# Patient Record
Sex: Female | Born: 1970 | Race: Black or African American | Hispanic: No | Marital: Married | Smoking: Never smoker
Health system: Southern US, Community
[De-identification: ages and names within clinical notes are randomized; demographics above are authoritative.]

## PROBLEM LIST (undated history)

## (undated) DIAGNOSIS — J45909 Unspecified asthma, uncomplicated: Secondary | ICD-10-CM

## (undated) DIAGNOSIS — I10 Essential (primary) hypertension: Secondary | ICD-10-CM

## (undated) DIAGNOSIS — R7303 Prediabetes: Secondary | ICD-10-CM

## (undated) DIAGNOSIS — C73 Malignant neoplasm of thyroid gland: Secondary | ICD-10-CM

## (undated) DIAGNOSIS — E079 Disorder of thyroid, unspecified: Secondary | ICD-10-CM

## (undated) HISTORY — DX: Malignant neoplasm of thyroid gland: C73

## (undated) HISTORY — DX: Prediabetes: R73.03

## (undated) HISTORY — DX: Unspecified asthma, uncomplicated: J45.909

## (undated) HISTORY — DX: Disorder of thyroid, unspecified: E07.9

## (undated) HISTORY — DX: Essential (primary) hypertension: I10

---

## 1997-08-01 ENCOUNTER — Other Ambulatory Visit: Admission: RE | Admit: 1997-08-01 | Discharge: 1997-08-01 | Payer: Self-pay | Admitting: Obstetrics and Gynecology

## 1998-01-06 ENCOUNTER — Other Ambulatory Visit: Admission: RE | Admit: 1998-01-06 | Discharge: 1998-01-06 | Payer: Self-pay | Admitting: Obstetrics and Gynecology

## 1998-12-31 ENCOUNTER — Other Ambulatory Visit: Admission: RE | Admit: 1998-12-31 | Discharge: 1998-12-31 | Payer: Self-pay | Admitting: Obstetrics and Gynecology

## 2000-01-06 ENCOUNTER — Other Ambulatory Visit: Admission: RE | Admit: 2000-01-06 | Discharge: 2000-01-06 | Payer: Self-pay | Admitting: Obstetrics and Gynecology

## 2003-03-22 ENCOUNTER — Other Ambulatory Visit: Admission: RE | Admit: 2003-03-22 | Discharge: 2003-03-22 | Payer: Self-pay | Admitting: Obstetrics and Gynecology

## 2008-07-22 ENCOUNTER — Telehealth (INDEPENDENT_AMBULATORY_CARE_PROVIDER_SITE_OTHER): Payer: Self-pay | Admitting: *Deleted

## 2008-07-24 ENCOUNTER — Encounter: Payer: Self-pay | Admitting: Obstetrics and Gynecology

## 2008-07-24 ENCOUNTER — Ambulatory Visit: Payer: Self-pay | Admitting: Obstetrics and Gynecology

## 2008-07-24 ENCOUNTER — Other Ambulatory Visit: Admission: RE | Admit: 2008-07-24 | Discharge: 2008-07-24 | Payer: Self-pay | Admitting: Obstetrics and Gynecology

## 2008-07-25 ENCOUNTER — Ambulatory Visit: Payer: Self-pay | Admitting: Obstetrics and Gynecology

## 2008-07-29 ENCOUNTER — Ambulatory Visit: Payer: Self-pay | Admitting: Internal Medicine

## 2008-07-29 DIAGNOSIS — J45909 Unspecified asthma, uncomplicated: Secondary | ICD-10-CM | POA: Insufficient documentation

## 2008-07-29 DIAGNOSIS — I10 Essential (primary) hypertension: Secondary | ICD-10-CM

## 2008-07-29 DIAGNOSIS — E559 Vitamin D deficiency, unspecified: Secondary | ICD-10-CM | POA: Insufficient documentation

## 2008-07-29 DIAGNOSIS — M545 Low back pain, unspecified: Secondary | ICD-10-CM | POA: Insufficient documentation

## 2008-07-29 DIAGNOSIS — M25559 Pain in unspecified hip: Secondary | ICD-10-CM

## 2008-07-29 DIAGNOSIS — L259 Unspecified contact dermatitis, unspecified cause: Secondary | ICD-10-CM

## 2008-07-29 LAB — CONVERTED CEMR LAB
ALT: 22 units/L (ref 0–35)
Albumin: 4 g/dL (ref 3.5–5.2)
Basophils Absolute: 0.1 10*3/uL (ref 0.0–0.1)
Bilirubin Urine: NEGATIVE
Bilirubin, Direct: 0.1 mg/dL (ref 0.0–0.3)
CO2: 29 meq/L (ref 19–32)
Calcium: 8.8 mg/dL (ref 8.4–10.5)
Chloride: 106 meq/L (ref 96–112)
Eosinophils Absolute: 0.2 10*3/uL (ref 0.0–0.7)
Glucose, Bld: 86 mg/dL (ref 70–99)
Hemoglobin, Urine: NEGATIVE
Hemoglobin: 14 g/dL (ref 12.0–15.0)
Ketones, ur: NEGATIVE mg/dL
Leukocytes, UA: NEGATIVE
Lymphocytes Relative: 29.2 % (ref 12.0–46.0)
Lymphs Abs: 2.2 10*3/uL (ref 0.7–4.0)
MCHC: 34.2 g/dL (ref 30.0–36.0)
MCV: 88.3 fL (ref 78.0–100.0)
Monocytes Absolute: 0.3 10*3/uL (ref 0.1–1.0)
Neutro Abs: 4.6 10*3/uL (ref 1.4–7.7)
Nitrite: NEGATIVE
RDW: 13 % (ref 11.5–14.6)
Sed Rate: 13 mm/hr (ref 0–22)
Sodium: 141 meq/L (ref 135–145)
TSH: 2.11 microintl units/mL (ref 0.35–5.50)
Total Protein: 7.4 g/dL (ref 6.0–8.3)
Urobilinogen, UA: 0.2 (ref 0.0–1.0)

## 2008-08-02 ENCOUNTER — Telehealth: Payer: Self-pay | Admitting: Internal Medicine

## 2009-01-11 HISTORY — PX: THYROIDECTOMY: SHX17

## 2010-03-12 ENCOUNTER — Encounter (INDEPENDENT_AMBULATORY_CARE_PROVIDER_SITE_OTHER): Payer: Federal, State, Local not specified - PPO | Admitting: Obstetrics and Gynecology

## 2010-03-12 DIAGNOSIS — Z01419 Encounter for gynecological examination (general) (routine) without abnormal findings: Secondary | ICD-10-CM

## 2013-06-21 ENCOUNTER — Telehealth: Payer: Self-pay | Admitting: Internal Medicine

## 2013-06-21 NOTE — Telephone Encounter (Signed)
appt schedule on 07/25/13 with Plot.

## 2013-06-21 NOTE — Telephone Encounter (Signed)
Ok to sch an OV to re-est. It is a very narrow window! Thx

## 2013-06-21 NOTE — Telephone Encounter (Signed)
Pt's mother Keiasha Diep) call request for her daughter to reestablish with Dr. Alain Marion due to [facility] difficulty, want referral to neurology. Pt is living out of the country ([place]) right now and will be here ([place]) around 07/25/13-07/27/13. Please advise, last ov 07/2008 (bcbs is insurance). Please advise. Please call [phone]

## 2013-07-25 ENCOUNTER — Telehealth: Payer: Self-pay | Admitting: Internal Medicine

## 2013-07-25 ENCOUNTER — Encounter: Payer: Self-pay | Admitting: Internal Medicine

## 2013-07-25 ENCOUNTER — Ambulatory Visit (INDEPENDENT_AMBULATORY_CARE_PROVIDER_SITE_OTHER): Payer: Federal, State, Local not specified - PPO | Admitting: Internal Medicine

## 2013-07-25 VITALS — BP 110/80 | HR 76 | Temp 98.2°F | Resp 16 | Ht 69.0 in | Wt 228.0 lb

## 2013-07-25 DIAGNOSIS — R197 Diarrhea, unspecified: Secondary | ICD-10-CM | POA: Insufficient documentation

## 2013-07-25 DIAGNOSIS — E0789 Other specified disorders of thyroid: Secondary | ICD-10-CM

## 2013-07-25 DIAGNOSIS — M545 Low back pain, unspecified: Secondary | ICD-10-CM

## 2013-07-25 DIAGNOSIS — I10 Essential (primary) hypertension: Secondary | ICD-10-CM

## 2013-07-25 DIAGNOSIS — E034 Atrophy of thyroid (acquired): Secondary | ICD-10-CM

## 2013-07-25 DIAGNOSIS — E039 Hypothyroidism, unspecified: Secondary | ICD-10-CM | POA: Insufficient documentation

## 2013-07-25 DIAGNOSIS — R413 Other amnesia: Secondary | ICD-10-CM

## 2013-07-25 DIAGNOSIS — R1031 Right lower quadrant pain: Secondary | ICD-10-CM

## 2013-07-25 DIAGNOSIS — E038 Other specified hypothyroidism: Secondary | ICD-10-CM

## 2013-07-25 DIAGNOSIS — J45909 Unspecified asthma, uncomplicated: Secondary | ICD-10-CM

## 2013-07-25 NOTE — Patient Instructions (Signed)
Gluten free trial (no wheat products) for 4-6 weeks. OK to use gluten-free bread and gluten-free pasta.  Milk free trial (no milk, ice cream, cheese and yogurt) for 4-6 weeks. OK to use almond, coconut, rice or soy milk. "Almond breeze" brand tastes good.  

## 2013-07-25 NOTE — Assessment & Plan Note (Signed)
Labs

## 2013-07-25 NOTE — Assessment & Plan Note (Signed)
x3-4 mo ?etiol CT abd

## 2013-07-25 NOTE — Assessment & Plan Note (Signed)
Doing well 

## 2013-07-25 NOTE — Progress Notes (Signed)
Pre visit review using our clinic review tool, if applicable. No additional management support is needed unless otherwise documented below in the visit note. 

## 2013-07-25 NOTE — Progress Notes (Signed)
   Subjective:    HPI  New pt - not seen in>3years  Has a 43 yo dtr  C/o R side pain back and to the front) off and on - she had an [place] in [place] which a ok  C/o an episode of diarrhea in Feb 2015 x2d and in May 2015 x 3 d. C/o loose stools, bloating and taking Imodium a lot. She will see Dr Michail Sermon (GI)  C/o memory issues, forgetful, lack of focus x months  C/o neck, back pain  Doesn't sleep well    Review of Systems  Constitutional: Negative for chills, activity change, appetite change, fatigue and unexpected weight change.  HENT: Negative for congestion, mouth sores and sinus pressure.   Eyes: Negative for visual disturbance.  Respiratory: Negative for cough and chest tightness.   Gastrointestinal: Negative for nausea and abdominal pain.  Genitourinary: Negative for frequency, difficulty urinating and vaginal pain.  Musculoskeletal: Negative for arthralgias, back pain and gait problem.  Skin: Negative for pallor and rash.  Neurological: Negative for dizziness, tremors, weakness, numbness and headaches.  Psychiatric/Behavioral: Positive for sleep disturbance. Negative for confusion. The patient is nervous/anxious.        Objective:   Physical Exam  Constitutional: She appears well-developed. No distress.  HENT:  Head: Normocephalic.  Right Ear: External ear normal.  Left Ear: External ear normal.  Nose: Nose normal.  Mouth/Throat: Oropharynx is clear and moist.  Eyes: Conjunctivae are normal. Pupils are equal, round, and reactive to light. Right eye exhibits no discharge. Left eye exhibits no discharge.  Neck: Normal range of motion. Neck supple. No JVD present. No tracheal deviation present. No thyromegaly present.  Cardiovascular: Normal rate, regular rhythm and normal heart sounds.   Pulmonary/Chest: No stridor. No respiratory distress. She has no wheezes.  Abdominal: Soft. Bowel sounds are normal. She exhibits no distension and no mass. There is no tenderness.  There is no rebound and no guarding.  Musculoskeletal: She exhibits no edema and no tenderness.  Lymphadenopathy:    She has no cervical adenopathy.  Neurological: She displays normal reflexes. No cranial nerve deficit. She exhibits normal muscle tone. Coordination normal.  Skin: No rash noted. No erythema.  Psychiatric: Her behavior is normal. Judgment and thought content normal.    A complex case      Assessment & Plan:

## 2013-07-25 NOTE — Assessment & Plan Note (Signed)
Continue with current prescription therapy as reflected on the Med list.  

## 2013-07-25 NOTE — Assessment & Plan Note (Addendum)
7/15 mild Denies OSA ?stress related  Labs Neurol ref

## 2013-07-25 NOTE — Telephone Encounter (Signed)
Relevant patient education assigned to patient using Emmi. ° °

## 2013-07-25 NOTE — Assessment & Plan Note (Signed)
R sided x months

## 2013-07-25 NOTE — Assessment & Plan Note (Signed)
2015 ?etiology R/o IBD, food intolerances, etc GI cons on Fri Dr Jiles Prows Labs

## 2013-07-26 ENCOUNTER — Other Ambulatory Visit (INDEPENDENT_AMBULATORY_CARE_PROVIDER_SITE_OTHER): Payer: Federal, State, Local not specified - PPO

## 2013-07-26 DIAGNOSIS — E038 Other specified hypothyroidism: Secondary | ICD-10-CM

## 2013-07-26 DIAGNOSIS — R197 Diarrhea, unspecified: Secondary | ICD-10-CM

## 2013-07-26 DIAGNOSIS — E034 Atrophy of thyroid (acquired): Secondary | ICD-10-CM

## 2013-07-26 DIAGNOSIS — R413 Other amnesia: Secondary | ICD-10-CM

## 2013-07-26 DIAGNOSIS — M545 Low back pain, unspecified: Secondary | ICD-10-CM

## 2013-07-26 DIAGNOSIS — I10 Essential (primary) hypertension: Secondary | ICD-10-CM

## 2013-07-26 DIAGNOSIS — E0789 Other specified disorders of thyroid: Secondary | ICD-10-CM

## 2013-07-26 LAB — CBC WITH DIFFERENTIAL/PLATELET
BASOS PCT: 0.4 % (ref 0.0–3.0)
Basophils Absolute: 0 10*3/uL (ref 0.0–0.1)
EOS PCT: 4.6 % (ref 0.0–5.0)
Eosinophils Absolute: 0.3 10*3/uL (ref 0.0–0.7)
HEMATOCRIT: 38.3 % (ref 36.0–46.0)
Hemoglobin: 12.9 g/dL (ref 12.0–15.0)
LYMPHS ABS: 1.9 10*3/uL (ref 0.7–4.0)
Lymphocytes Relative: 32.1 % (ref 12.0–46.0)
MCHC: 33.6 g/dL (ref 30.0–36.0)
MCV: 88 fl (ref 78.0–100.0)
Monocytes Absolute: 0.4 10*3/uL (ref 0.1–1.0)
Monocytes Relative: 7.6 % (ref 3.0–12.0)
Neutro Abs: 3.2 10*3/uL (ref 1.4–7.7)
Neutrophils Relative %: 55.3 % (ref 43.0–77.0)
PLATELETS: 250 10*3/uL (ref 150.0–400.0)
RBC: 4.35 Mil/uL (ref 3.87–5.11)
RDW: 13.9 % (ref 11.5–15.5)
WBC: 5.9 10*3/uL (ref 4.0–10.5)

## 2013-07-26 LAB — H. PYLORI ANTIBODY, IGG: H Pylori IgG: NEGATIVE

## 2013-07-26 LAB — BASIC METABOLIC PANEL
BUN: 13 mg/dL (ref 6–23)
CHLORIDE: 106 meq/L (ref 96–112)
CO2: 30 meq/L (ref 19–32)
CREATININE: 1.2 mg/dL (ref 0.4–1.2)
Calcium: 9 mg/dL (ref 8.4–10.5)
GFR: 61.12 mL/min (ref >60.00)
Glucose, Bld: 89 mg/dL (ref 70–99)
Potassium: 3.6 mEq/L (ref 3.5–5.1)
Sodium: 139 mEq/L (ref 135–145)

## 2013-07-26 LAB — LIPID PANEL
CHOL/HDL RATIO: 3
Cholesterol: 164 mg/dL (ref 0–200)
HDL: 47.9 mg/dL (ref >39.00)
LDL Cholesterol: 104 mg/dL — ABNORMAL HIGH (ref 0–99)
NonHDL: 116.1
Triglycerides: 61 mg/dL (ref 0.0–149.0)
VLDL: 12.2 mg/dL (ref 0.0–40.0)

## 2013-07-26 LAB — URINALYSIS
BILIRUBIN URINE: NEGATIVE
Hgb urine dipstick: NEGATIVE
KETONES UR: NEGATIVE
Leukocytes, UA: NEGATIVE
Nitrite: NEGATIVE
PH: 6 (ref 5.0–8.0)
Specific Gravity, Urine: 1.025 (ref 1.000–1.030)
Total Protein, Urine: NEGATIVE
Urine Glucose: NEGATIVE
Urobilinogen, UA: 0.2 (ref 0.0–1.0)

## 2013-07-26 LAB — HEPATIC FUNCTION PANEL
ALT: 25 U/L (ref 0–35)
AST: 30 U/L (ref 0–37)
Albumin: 3.8 g/dL (ref 3.5–5.2)
Alkaline Phosphatase: 43 U/L (ref 39–117)
Bilirubin, Direct: 0.1 mg/dL (ref 0.0–0.3)
TOTAL PROTEIN: 6.9 g/dL (ref 6.0–8.3)
Total Bilirubin: 0.4 mg/dL (ref 0.2–1.2)

## 2013-07-26 LAB — VITAMIN D 25 HYDROXY (VIT D DEFICIENCY, FRACTURES): VITD: 31.5 ng/mL

## 2013-07-26 LAB — IBC PANEL
IRON: 48 ug/dL (ref 42–145)
Saturation Ratios: 13.1 % — ABNORMAL LOW (ref 20.0–50.0)
TRANSFERRIN: 262.3 mg/dL (ref 212.0–360.0)

## 2013-07-26 LAB — TSH: TSH: 1.78 u[IU]/mL (ref 0.35–4.50)

## 2013-07-26 LAB — T4, FREE: FREE T4: 0.93 ng/dL (ref 0.60–1.60)

## 2013-07-26 LAB — RHEUMATOID FACTOR

## 2013-07-26 LAB — SEDIMENTATION RATE: Sed Rate: 19 mm/hr (ref 0–22)

## 2013-07-26 LAB — VITAMIN B12: Vitamin B-12: 582 pg/mL (ref 211–911)

## 2013-07-27 LAB — GIARDIA/CRYPTOSPORIDIUM (EIA)
Cryptosporidium Screen (EIA): NEGATIVE
GIARDIA SCREEN (EIA): NEGATIVE

## 2013-07-27 LAB — ALLERGEN FOOD PROFILE SPECIFIC IGE
APPLE: 0.11 kU/L — AB
CORN: 0.21 kU/L — AB
Chicken IgE: <0.1 kU/L
Egg White IgE: <0.1 kU/L
Fish Cod: <0.1 kU/L
IgE (Immunoglobulin E), Serum: 41.7 IU/mL (ref 0.0–180.0)
Milk IgE: <0.1 kU/L
Shrimp IgE: <0.1 kU/L
Tuna IgE: <0.1 kU/L
Wheat IgE: <0.1 kU/L

## 2013-08-01 LAB — IGG FOOD PANEL
ALLERGEN BEEF IGG: 6 ug/mL — AB (ref <2.0)
ALLERGEN EGG YOLK IGG: 7.5 ug/mL — AB (ref <2.0)
Allergen, Milk, IgG: 14.1 ug/mL — ABNORMAL HIGH (ref <0.15)
Corn, IgG: <0.15 ug/mL (ref <0.15)
Egg white, IgG: 7 ug/mL — ABNORMAL HIGH (ref <2.0)
Peanut, IgG: <0.15 ug/mL (ref <0.15)
Wheat, IgG: 1.25 ug/mL — ABNORMAL HIGH (ref <0.15)

## 2013-08-02 ENCOUNTER — Telehealth: Payer: Self-pay | Admitting: *Deleted

## 2013-08-02 NOTE — Telephone Encounter (Signed)
Left msg on triage stating her daughter received a msg on mychart stating the CT scan appt was made for tomorrow. Daughter is out of the country she is in [place] want return until Sunday. Wanting to reschedule CT. Called pt om back gave her # to CT dept @ cardiology...Johny Chess

## 2013-08-03 ENCOUNTER — Other Ambulatory Visit: Payer: Federal, State, Local not specified - PPO

## 2013-08-06 ENCOUNTER — Other Ambulatory Visit: Payer: Federal, State, Local not specified - PPO

## 2013-08-07 ENCOUNTER — Encounter: Payer: Self-pay | Admitting: Neurology

## 2013-08-07 ENCOUNTER — Ambulatory Visit (INDEPENDENT_AMBULATORY_CARE_PROVIDER_SITE_OTHER): Payer: Federal, State, Local not specified - PPO | Admitting: Neurology

## 2013-08-07 ENCOUNTER — Ambulatory Visit (INDEPENDENT_AMBULATORY_CARE_PROVIDER_SITE_OTHER)
Admission: RE | Admit: 2013-08-07 | Discharge: 2013-08-07 | Disposition: A | Discharge status: Home / Self Care | Payer: Federal, State, Local not specified - PPO | Source: Ambulatory Visit | Attending: Internal Medicine | Admitting: Internal Medicine

## 2013-08-07 VITALS — BP 140/84 | HR 71 | Ht 69.0 in | Wt 235.9 lb

## 2013-08-07 DIAGNOSIS — M545 Low back pain, unspecified: Secondary | ICD-10-CM

## 2013-08-07 DIAGNOSIS — R413 Other amnesia: Secondary | ICD-10-CM

## 2013-08-07 DIAGNOSIS — R209 Unspecified disturbances of skin sensation: Secondary | ICD-10-CM

## 2013-08-07 DIAGNOSIS — R1031 Right lower quadrant pain: Secondary | ICD-10-CM

## 2013-08-07 DIAGNOSIS — R200 Anesthesia of skin: Secondary | ICD-10-CM

## 2013-08-07 MED ORDER — IOHEXOL 300 MG/ML  SOLN
100.0000 mL | Freq: Once | INTRAMUSCULAR | Status: AC | PRN
  Administered 2013-08-07: 100 mL via INTRAVENOUS

## 2013-08-07 NOTE — Patient Instructions (Addendum)
1. MRI brain with and without contrast 2. Follow-up in 3 months

## 2013-08-07 NOTE — Progress Notes (Signed)
NEUROLOGY CONSULTATION NOTE  JAKYA DOVIDIO MRN: [id number] DOB: 1970/09/01  Referring provider: Dr. Lew Dawes Primary care provider: Dr. Lew Dawes  Reason for consult:  Memory loss, numbness, falls  Dear Dr Alain Marion:  Thank you for your kind referral of Laurie Hawkins for consultation of the above symptoms. Although her history is well known to you, please allow me to reiterate it for the purpose of our medical record. Records and images were personally reviewed where available.  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 She has been living and working in [place] for the past 8 years, her family lives in [place].  She has noticed that over the past 1-2 years, she started noticing that she would be mid-thought then lose a word, or interject at inappropriate times because she is afraid she would lose her thought.  Her colleagues had been amazed with her recall of cases, however recently she has to revisit facts and has noticed that she is not absorbing information the way she should be.  She has difficulties concentrating, needing to start over after reading a page.  She now needs constant reminders on her phone. Her husband and daughter have reminded her that she was already told that information previously but she does not recall it.  She denies getting lost driving or missing bill payments or medication.  She does note that work is intense and has changed at the end of 2014, she is now doing different cases, which is "not my cup of tea."  She is often tired, usually sleeping 3-4 hours at night, and catching a nap on the train ride to work.  Over the past 2 years, she has had increased frequency of falls, quickly losing her balance or feeling dizzy, no loss of consciousness. Last fall was 2 months ago.  She has had  occasional headaches since her 30s, mostly around the frontal and orbital regions, occurring twice a week or so, lasting up to half a day, with no associated nausea/vomiting/photo/phonophobia. She has attributed this to hypertension and takes an unrecalled medication prescribed in [place]. She has occasional blurred vision upon awakening and has noticed that she sometimes sees something out of the corner of her eye but nothing is there.  She has noticed times where she feels her muscles are "jumping," she looks down and sees small twitching of the muscles in her arms, lasting up to an hour, no pain.  She has occasional leg cramps and pain in the soles of both feet.  She has chronic back pain and sees an Ortho specialist in [place], however she has noticed that pain is more intense the past few months, localized to the mid-lower back. She has occasional tingling pain in her right flank radiating down to the right groin. She has occasional neck pain.  She is scheduled for a CT pelvis today due to episodes of explosive diarrhea in Feb and May 2015 and will be seeing GI.  She denies any urinary incontinence or perineal numbness. No family history of similar symptoms, his father had memory problems from mini-strokes.  Laboratory Data: Lab Results  Component Value Date   WBC 5.9 07/26/2013   HGB 12.9 07/26/2013   HCT 38.3 07/26/2013   MCV 88.0 07/26/2013   PLT 250.0 07/26/2013     Chemistry      Component Value  Date/Time   NA 139 07/26/2013 0926   K 3.6 07/26/2013 0926   CL 106 07/26/2013 0926   CO2 30 07/26/2013 0926   BUN 13 07/26/2013 0926   CREATININE 1.2 07/26/2013 0926      Component Value Date/Time   CALCIUM 9.0 07/26/2013 0926   ALKPHOS 43 07/26/2013 0926   AST 30 07/26/2013 0926   ALT 25 07/26/2013 0926   BILITOT 0.4 07/26/2013 0926     Lab Results  Component Value Date   TSH 1.78 07/26/2013   Lab Results  Component Value Date   VITAMINB12 582 07/26/2013     PAST MEDICAL HISTORY: Past Medical  History  Diagnosis Date  . Hypertension     PAST SURGICAL HISTORY: Past Surgical History  Procedure Laterality Date  . Cesarean section  2009  . Thyroidectomy  2011    MEDICATIONS: Current Outpatient Prescriptions on File Prior to Visit  Medication Sig Dispense Refill  . albuterol (PROVENTIL HFA;VENTOLIN HFA) 108 (90 BASE) MCG/ACT inhaler Inhale into the lungs every 6 (six) hours as needed for wheezing or shortness of breath.      . cetirizine (ZYRTEC) 10 MG tablet Take 10 mg by mouth daily.      . Cholecalciferol LIQD by Does not apply route every 21 ( twenty-one) days.      Marland Kitchen EPINEPHrine (EPIPEN) 0.3 mg/0.3 mL IJ SOAJ injection Inject into the muscle once.      Marland Kitchen levothyroxine (SYNTHROID, LEVOTHROID) 100 MCG tablet Take 100 mcg by mouth as directed.      Marland Kitchen levothyroxine (SYNTHROID, LEVOTHROID) 125 MCG tablet Take 125 mcg by mouth as directed.      . Multiple Vitamin (MULTIVITAMIN) tablet Take 1 tablet by mouth daily.      . Olopatadine HCl (PAZEO) 0.7 % SOLN Apply to eye once.      Marland Kitchen Propylene Glycol (SYSTANE BALANCE OP) Apply 1 drop to eye 3 (three) times daily.       No current facility-administered medications on file prior to visit.    ALLERGIES: No Known Allergies  FAMILY HISTORY: Family History  Problem Relation Age of Onset  . Arthritis Other   . Alcohol abuse Other   . Cancer Other     Breast and Prostate  . Kidney disease Other   . Mental illness Other   . Diabetes Other   . Sarcoidosis Other   . Rheum arthritis Other     SOCIAL HISTORY: History   Social History  . Marital Status: Married    Spouse Name: N/A    Number of Children: N/A  . Years of Education: N/A   Occupational History  . Not on file.   Social History Main Topics  . Smoking status: Never Smoker   . Smokeless tobacco: Not on file  . Alcohol Use: Yes  . Drug Use: No  . Sexual Activity: Not on file   Other Topics Concern  . Not on file   Social History Narrative  . No  narrative on file    REVIEW OF SYSTEMS: Constitutional: No fevers, chills, or sweats, + generalized fatigue, no change in appetite Eyes: as above Ear, nose and throat: No hearing loss, ear pain, nasal congestion, sore throat Cardiovascular: No chest pain, palpitations Respiratory:  No shortness of breath at rest or with exertion, wheezes GastrointestinaI: No nausea, vomiting, +diarrhea, abdominal pain, fecal incontinence Genitourinary:  No dysuria, urinary retention or frequency Musculoskeletal:  + neck pain, back pain Integumentary: No rash, pruritus, skin lesions  Neurological: as above Psychiatric: No depression, + insomnia, no anxiety Endocrine: No palpitations, fatigue, diaphoresis, mood swings, change in appetite, change in weight, increased thirst Hematologic/Lymphatic:  No anemia, purpura, petechiae. Allergic/Immunologic: no itchy/runny eyes, nasal congestion, recent allergic reactions, rashes  PHYSICAL EXAM: Filed Vitals:   08/07/13 0818  BP: 140/84  Pulse: 71   General: No acute distress Head:  Normocephalic/atraumatic Eyes: Fundoscopic exam shows bilateral sharp discs, no vessel changes, exudates, or hemorrhages Neck: supple, no paraspinal tenderness, full range of motion Back: No paraspinal tenderness Heart: regular rate and rhythm Lungs: Clear to auscultation bilaterally. Vascular: No carotid bruits. Skin/Extremities: No rash, no edema Neurological Exam: Mental status: alert and oriented to person, place, and time, no dysarthria or aphasia, Fund of knowledge is appropriate.  Recent and remote memory are intact.  Attention and concentration are normal.    Able to name objects and repeat phrases. MOCA 30/30. Cranial nerves: CN I: not tested CN II: pupils equal, round and reactive to light, visual fields intact, fundi unremarkable. CN III, IV, VI:  full range of motion, no nystagmus, no ptosis CN V: reports decreased pin on the left V1-3 with a sensation of spreading  tingling to pin, intact to cold and light touch CN VII: upper and lower face symmetric CN VIII: hearing intact to finger rub CN IX, X: gag intact, uvula midline CN XI: sternocleidomastoid and trapezius muscles intact CN XII: tongue midline Bulk & Tone: normal, no fasciculations. Motor: 5/5 throughout with no pronator drift. Sensation: reports decreased pin on left UE and LE with spreading tingling to pin, intact to cold, vibration and joint position sense.  No extinction to double simultaneous stimulation.  Romberg test negative Deep Tendon Reflexes: +2 throughout, no ankle clonus Plantar responses: downgoing bilaterally Cerebellar: no incoordination on finger to nose, heel to shin. No dysdiadochokinesia Gait: narrow-based and steady, able to tandem walk adequately. Tremor: none  IMPRESSION: This is a very pleasant 43 year old right-handed woman with a history of hypertension and hyperlipidemia presenting with cognitive changes over the past year or so. She has also noticed balance problems with increased falls, and has headaches twice a week.  Her neurological exam shows normal MOCA 30/30, and sensory changes over the left face/arm/leg.  MRI brain with and without contrast will be ordered to assess for underlying structural abnormality.  We are slightly limited by time as she is only here for a week until she returns to [place], however if MRI brain is unremarkable, consideration for neuropsychological testing can be done.  She will follow-up in 3 months.  Thank you for allowing me to participate in the care of this patient. Please do not hesitate to call for any questions or concerns.   Ellouise Newer, M.D.  CC: Dr. Alain Marion

## 2013-08-08 ENCOUNTER — Ambulatory Visit
Admission: RE | Admit: 2013-08-08 | Discharge: 2013-08-08 | Disposition: A | Discharge status: Home / Self Care | Payer: Federal, State, Local not specified - PPO | Source: Ambulatory Visit | Attending: Neurology | Admitting: Neurology

## 2013-08-08 DIAGNOSIS — R200 Anesthesia of skin: Secondary | ICD-10-CM

## 2013-08-08 DIAGNOSIS — R413 Other amnesia: Secondary | ICD-10-CM

## 2013-08-08 MED ORDER — GADOBENATE DIMEGLUMINE 529 MG/ML IV SOLN
20.0000 mL | Freq: Once | INTRAVENOUS | Status: AC | PRN
  Administered 2013-08-08: 20 mL via INTRAVENOUS

## 2013-08-10 ENCOUNTER — Telehealth: Payer: Self-pay | Admitting: Neurology

## 2013-08-10 NOTE — Telephone Encounter (Signed)
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 Discussed arachnoid cysts, usually an incidental finding that is asymptomatic in majority of patients, however with it's location in the temporal lobe and her symptoms of cognitive/memory changes, I would recommend neuropsychological evaluation to further assess if this is a symptomatic lesion or not.  If it is, there have been studies showing that cyst decompression has caused improvement in cognition.  She is travelling back to [place] and will think about it.

## 2013-09-20 ENCOUNTER — Encounter: Payer: Self-pay | Admitting: Internal Medicine

## 2013-09-20 ENCOUNTER — Encounter: Payer: Self-pay | Admitting: Neurology

## 2013-10-26 ENCOUNTER — Other Ambulatory Visit: Payer: Self-pay

## 2014-01-22 ENCOUNTER — Ambulatory Visit
Admission: RE | Admit: 2014-01-22 | Discharge: 2014-01-22 | Disposition: A | Discharge status: Home / Self Care | Payer: Federal, State, Local not specified - PPO | Source: Ambulatory Visit

## 2014-01-22 ENCOUNTER — Other Ambulatory Visit: Payer: Self-pay

## 2014-01-22 DIAGNOSIS — Z1231 Encounter for screening mammogram for malignant neoplasm of breast: Secondary | ICD-10-CM

## 2014-01-22 DIAGNOSIS — Z803 Family history of malignant neoplasm of breast: Secondary | ICD-10-CM

## 2014-02-04 ENCOUNTER — Encounter: Payer: Self-pay | Admitting: Neurology

## 2014-02-04 ENCOUNTER — Telehealth: Payer: Self-pay | Admitting: Neurology

## 2014-02-04 NOTE — Telephone Encounter (Signed)
Neuropsychological evaluation reviewed: Impression: Cognitive diagnosis deferred. 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 At present, Laurie Hawkins does not appear to meet criteria for a specific cognitive disorder as her pattern of performances is generally intact aside from mildly diminished but equal fine motor dexterity and a tendency to slow down somewhat in order to ensure accuracy of visual scanning with no associated errors of visual scanning. Most likely contributing factors to her subjective cognitive deficits and minimal isolated suboptimal scores is sleep cycle interruption/sleep deprivation, chronic pain, and anxiety. Her neurocognitive profile and fine motor speed is more consistent with possible suboptimal frontal processes (eg secondary to sleep deprivation) as opposed to diminished performance associated with right temporal lobe dysfunction secondary to her arachnoid cyst. Her personality inventory suggests a possible tendency to experience physiological symptoms in response to emotional distress.   Recommendations: She would likely benefit from supportive and cognitive behavioral psychotherapy to enhance insight and psychological coping strategies as well as address anxiety, chronic pain, and somatic focus. She may benefit from addition of psychotropic medication to address anxiety (ie low dose SSRI, azapirone)

## 2014-02-06 ENCOUNTER — Other Ambulatory Visit (HOSPITAL_COMMUNITY)
Admission: RE | Admit: 2014-02-06 | Discharge: 2014-02-06 | Disposition: A | Discharge status: Home / Self Care | Payer: Federal, State, Local not specified - PPO | Source: Ambulatory Visit | Attending: Gynecology | Admitting: Gynecology

## 2014-02-06 ENCOUNTER — Encounter: Payer: Self-pay | Admitting: Women's Health

## 2014-02-06 ENCOUNTER — Ambulatory Visit (INDEPENDENT_AMBULATORY_CARE_PROVIDER_SITE_OTHER): Payer: Federal, State, Local not specified - PPO | Admitting: Women's Health

## 2014-02-06 VITALS — BP 126/82 | Ht 69.0 in | Wt 252.0 lb

## 2014-02-06 DIAGNOSIS — Z01419 Encounter for gynecological examination (general) (routine) without abnormal findings: Secondary | ICD-10-CM

## 2014-02-06 NOTE — Patient Instructions (Signed)

## 2014-02-06 NOTE — Progress Notes (Signed)
Laurie Hawkins Feb 20, 1970 [id number]    History:    Presents for annual exam.  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 Thyroid cancer 2011/thyroidectomy in [place]  on replacement with stable levels. Hypertension/hypothyroidism managed by primary care. Reports normal Paps and mammograms. Normal mammogram 01/2014.  Past medical history, past surgical history, family history and social history were all reviewed and documented in the EPIC chart. Mother breast cancer, recently had lumpectomy and is starting chemotherapy. BRCA testing to be done at a later time. 10-year-old daughter Normand Sloop doing well.  ROS:  A ROS was performed and pertinent positives and negatives are included.  Exam:  Filed Vitals:   02/06/14 1055  BP: 126/82    General appearance:  Normal Thyroid:  Symmetrical, normal in size, without palpable masses or nodularity. Respiratory  Auscultation:  Clear without wheezing or rhonchi Cardiovascular  Auscultation:  Regular rate, without rubs, murmurs or gallops  Edema/varicosities:  Not grossly evident Abdominal  Soft,nontender, without masses, guarding or rebound.  Liver/spleen:  No organomegaly noted  Hernia:  None appreciated  Skin  Inspection:  Grossly normal   Breasts: Examined lying and sitting.     Right: Without masses, retractions, discharge or axillary adenopathy.     Left: Without masses, retractions, discharge or axillary adenopathy. Gentitourinary   Inguinal/mons:  Normal without inguinal adenopathy  External genitalia:  Normal  BUS/Urethra/Skene's glands:  Normal  Vagina:  Normal  Cervix:  Normal  Uterus:   normal in size, shape and contour.  Midline and mobile  Adnexa/parametria:     Rt: Without masses or tenderness.   Lt: Without masses or tenderness.  Anus and perineum: Normal  Digital rectal exam: Normal sphincter tone without palpated masses or tenderness  Assessment/Plan:  44 y.o. MBF G1P1 for annual exam  with no complaints.  Regular monthly cycle/condoms Hypothyroid/hypertension-primary care manages labs and meds Obesity  Plan: SBE's, continue annual screening mammogram 3-D tomography reviewed and encouraged. Increase regular exercise and decrease calories for weight loss, calcium rich diet, vitamin D 1000 daily encouraged. Contraception options reviewed and declined will continue condoms. UA, Pap with HR HPV typing, new screening guidelines reviewed.    Huel Cote WHNP, 1:02 PM 02/06/2014

## 2014-02-07 LAB — URINALYSIS W MICROSCOPIC + REFLEX CULTURE
BACTERIA UA: NONE SEEN
Bilirubin Urine: NEGATIVE
CRYSTALS: NONE SEEN
Casts: NONE SEEN
Glucose, UA: NEGATIVE mg/dL
Hgb urine dipstick: NEGATIVE
KETONES UR: NEGATIVE mg/dL
Leukocytes, UA: NEGATIVE
Nitrite: NEGATIVE
PH: 5.5 (ref 5.0–8.0)
Protein, ur: NEGATIVE mg/dL
SQUAMOUS EPITHELIAL / LPF: NONE SEEN
Specific Gravity, Urine: 1.017 (ref 1.005–1.030)
UROBILINOGEN UA: 0.2 mg/dL (ref 0.0–1.0)

## 2014-02-11 ENCOUNTER — Encounter: Payer: Self-pay | Admitting: Women's Health

## 2014-02-11 LAB — CYTOLOGY - PAP

## 2014-07-08 ENCOUNTER — Encounter: Payer: Self-pay | Admitting: Neurology

## 2014-07-08 ENCOUNTER — Ambulatory Visit (INDEPENDENT_AMBULATORY_CARE_PROVIDER_SITE_OTHER): Payer: Federal, State, Local not specified - PPO | Admitting: Neurology

## 2014-07-08 VITALS — BP 120/84 | HR 70 | Resp 16 | Ht 69.0 in | Wt 239.0 lb

## 2014-07-08 DIAGNOSIS — R413 Other amnesia: Secondary | ICD-10-CM | POA: Insufficient documentation

## 2014-07-08 DIAGNOSIS — G930 Cerebral cysts: Secondary | ICD-10-CM | POA: Diagnosis not present

## 2014-07-08 NOTE — Progress Notes (Signed)
NEUROLOGY FOLLOW UP OFFICE NOTE  Laurie Hawkins [id number]  HISTORY OF PRESENT ILLNESS: I had the pleasure of seeing Laurie Hawkins in follow-up in the neurology clinic on 07/08/2014.  The patient was last seen almost a year ago for worsening memory. 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 She lives in [place], and we had discussed over the phone that these are usually an incidental finding that is asymptomatic in majority of patients, however with it's location in the temporal lobe and her symptoms of cognitive/memory changes, I had recommended neuropsychological evaluation to further assess if this is a symptomatic lesion or not.I personally reviewed Neuropsych eval done 11/2013:  Impression: Cognitive diagnosis deferred. 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 At present, Laurie Hawkins does not appear to meet criteria for a specific cognitive disorder as her pattern of performances is generally intact aside from mildly diminished but equal fine motor dexterity and a tendency to slow down somewhat in order to ensure accuracy of visual scanning with no associated errors of visual scanning. Most likely contributing factors to her subjective cognitive deficits and minimal isolated suboptimal scores is sleep cycle interruption/sleep deprivation, chronic pain, and anxiety. Her neurocognitive profile and fine motor speed is more consistent with possible suboptimal frontal processes (eg secondary to sleep deprivation) as opposed to diminished performance associated with right temporal lobe dysfunction secondary to her arachnoid cyst. Her personality inventory suggests a possible tendency to experience physiological symptoms in response to emotional distress.   Recommendations: She would likely benefit from supportive and cognitive behavioral psychotherapy to enhance  insight and psychological coping strategies as well as address anxiety, chronic pain, and somatic focus. She may benefit from addition of psychotropic medication to address anxiety (ie low dose SSRI, azapirone)  She returns today on vacation from [place], reporting that after her NP session, she was reassured and feels that her memory is better because she is less worried about it now. The word-finding difficulties are better. She denies any difficulties at work. She still has sleep problems, mostly due to her work schedule. Her balance is still an issue, going up stairs or uneven surfaces makes her feel nervous and unsteady. She fell last September 2015 walking to the train station, she twisted and sprained her left ankle. She had couple of smaller incidents, then had another bigger fall in [place] at the beginning of this month while going down steps. She went down on her knees and injured the same ankle, with significant swelling. She recalls her vision looking at the crosswalk was briefly blurred. No loss of consciousness. She had an xray and was told there are no fractures. She now has burning pain on the inner side of her left foot and plans to see Ortho while she is in the [place].   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 She has been living and working in [place] for the past 8 years, her family lives in [place]. She started noticing that she would be mid-thought then lose a word, or interject at inappropriate times because she is afraid she would lose her thought. Her colleagues had been amazed with her recall of cases, however recently she has to revisit facts and has noticed that she is not absorbing information the way she should  be. She has difficulties concentrating, needing to start over after reading a page. She now needs constant reminders on her phone. Her husband and daughter have reminded  her that she was already told that information previously but she does not recall it. She denies getting lost driving or missing bill payments or medication. She does note that work is intense and has changed at the end of 2014, she is now doing different cases, which is "not my cup of tea." She is often tired, usually sleeping 3-4 hours at night, and catching a nap on the train ride to work.  Over the past 2 years, she has had increased frequency of falls, quickly losing her balance or feeling dizzy, no loss of consciousness. She has noticed times where she feels her muscles are "jumping," she looks down and sees small twitching of the muscles in her arms, lasting up to an hour, no pain. She has occasional leg cramps and pain in the soles of both feet. She has chronic back pain and sees an Ortho specialist in [place], however she has noticed that pain is more intense the past few months, localized to the mid-lower back. No family history of similar symptoms, his father had memory problems from mini-strokes.  PAST MEDICAL HISTORY: Past Medical History  Diagnosis Date  . Hypertension     MEDICATIONS: Current Outpatient Prescriptions on File Prior to Visit  Medication Sig Dispense Refill  . albuterol (PROVENTIL HFA;VENTOLIN HFA) 108 (90 BASE) MCG/ACT inhaler Inhale into the lungs every 6 (six) hours as needed for wheezing or shortness of breath.    . cetirizine (ZYRTEC) 10 MG tablet Take 10 mg by mouth daily.    . Cholecalciferol LIQD by Does not apply route every 21 ( twenty-one) days.    Marland Kitchen EPINEPHrine (EPIPEN) 0.3 mg/0.3 mL IJ SOAJ injection Inject into the muscle as needed.     . IRON PO Take 1 tablet by mouth daily.    Marland Kitchen levothyroxine (SYNTHROID, LEVOTHROID) 100 MCG tablet Take 100 mcg by mouth as directed.    Marland Kitchen levothyroxine (SYNTHROID, LEVOTHROID) 125 MCG tablet Take 125 mcg by mouth as directed.    . Multiple Vitamin (MULTIVITAMIN) tablet Take 1 tablet by mouth daily.    . NON FORMULARY  BIFRIZIDE (FOR HTN)  1 TAB DAILY    . Olopatadine HCl (PAZEO) 0.7 % SOLN Apply to eye as directed.     Marland Kitchen Propylene Glycol (SYSTANE BALANCE OP) Apply 1 drop to eye 3 (three) times daily.     No current facility-administered medications on file prior to visit.    ALLERGIES: Allergies  Allergen Reactions  . Bee Venom Anaphylaxis    FAMILY HISTORY: Family History  Problem Relation Age of Onset  . Arthritis Other   . Alcohol abuse Other   . Cancer Other     Breast and Prostate  . Kidney disease Other   . Mental illness Other   . Diabetes Other   . Sarcoidosis Other   . Rheum arthritis Other     SOCIAL HISTORY: History   Social History  . Marital Status: Married    Spouse Name: N/A  . Number of Children: N/A  . Years of Education: N/A   Occupational History  . Not on file.   Social History Main Topics  . Smoking status: Never Smoker   . Smokeless tobacco: Not on file  . Alcohol Use: 0.0 oz/week    0 Standard drinks or equivalent per week  . Drug Use:  No  . Sexual Activity: Yes    Birth Control/ Protection: Condom     Comment: DECLINED INSURANCE QUESTIONS   Other Topics Concern  . Not on file   Social History Narrative    REVIEW OF SYSTEMS: Constitutional: No fevers, chills, or sweats, no generalized fatigue, change in appetite Eyes: No visual changes, double vision, eye pain Ear, nose and throat: No hearing loss, ear pain, nasal congestion, sore throat Cardiovascular: No chest pain, palpitations Respiratory:  No shortness of breath at rest or with exertion, wheezes GastrointestinaI: No nausea, vomiting, diarrhea, abdominal pain, fecal incontinence Genitourinary:  No dysuria, urinary retention or frequency Musculoskeletal:  No neck pain, +back pain Integumentary: No rash, pruritus, skin lesions Neurological: as above Psychiatric: No depression, insomnia, anxiety Endocrine: No palpitations, fatigue, diaphoresis, mood swings, change in appetite, change in  weight, increased thirst Hematologic/Lymphatic:  No anemia, purpura, petechiae. Allergic/Immunologic: no itchy/runny eyes, nasal congestion, recent allergic reactions, rashes  PHYSICAL EXAM: Filed Vitals:   07/08/14 1158  BP: 120/84  Pulse: 70  Resp: 16   General: No acute distress Head:  Normocephalic/atraumatic Neck: supple, no paraspinal tenderness, full range of motion Heart:  Regular rate and rhythm Lungs:  Clear to auscultation bilaterally Back: No paraspinal tenderness Skin/Extremities: No rash, no edema. Left foot wrapped with ACE bandage. Neurological Exam: alert and oriented to person, place, and time. No aphasia or dysarthria. Fund of knowledge is appropriate.  Recent and remote memory are intact. 3/3 delayed recall.  Attention and concentration are normal.    Able to name objects and repeat phrases. Cranial nerves: Pupils equal, round, reactive to light.  Fundoscopic exam unremarkable, no papilledema. Extraocular movements intact with no nystagmus. Visual fields full. Facial sensation decreased to light touch on right, intact to cold. No facial asymmetry. Tongue, uvula, palate midline.  Motor: Bulk and tone normal, muscle strength 5/5 throughout with no pronator drift.  Sensation to light touch, temperature intact.  No extinction to double simultaneous stimulation.  Deep tendon reflexes 2+ throughout, toes downgoing.  Finger to nose testing intact.  Gait narrow-based and steady, able to tandem walk adequately.  Romberg negative.  IMPRESSION: This is a very pleasant 44 yo RH woman with a history of hypertension and hyperlipidemia who presented with cognitive changes since 2014 or so. Her MRI brain had shown an arachnoid cyst in the right temporal region. Her neuropsychological evaluation did not show any evidence of right temporal dysfunction. It indicated an adjustment disorder with anxiety (mild to moderate); rule out adjustment disorder with mixed anxiety and depressed mood; rule  out generalized anxiety disorder. She did not meet criteria for a specific cognitive disorder as her pattern of performances is generally intact. Most likely contributing factors to her subjective cognitive deficits and minimal isolated suboptimal scores is sleep cycle interruption/sleep deprivation, chronic pain, and anxiety. Her neurocognitive profile and fine motor speed is more consistent with possible suboptimal frontal processes (eg secondary to sleep deprivation) as opposed to diminished performance associated with right temporal lobe dysfunction secondary to her arachnoid cyst. She reports that memory seems better now that she is not worrying about it. We discussed MRI findings and reviewed her scan today. She plans to see Ortho for the left ankle pain and swelling. No further neurological workup at this time, she knows to call our office for any changes, otherwise she will follow-up on a prn basis.   Thank you for allowing me to participate in her care.  Please do not hesitate to call for  any questions or concerns.  The duration of this appointment visit was 25 minutes of face-to-face time with the patient.  Greater than 50% of this time was spent in counseling, explanation of diagnosis, planning of further management, and coordination of care.   Ellouise Newer, M.D.   CC: Dr. Alain Marion

## 2014-07-08 NOTE — Patient Instructions (Addendum)
1. Follow-up on an as needed basis, call our office for any changes 2. Safe travels!

## 2014-07-09 ENCOUNTER — Ambulatory Visit (INDEPENDENT_AMBULATORY_CARE_PROVIDER_SITE_OTHER)
Admission: RE | Admit: 2014-07-09 | Discharge: 2014-07-09 | Disposition: A | Discharge status: Home / Self Care | Payer: Federal, State, Local not specified - PPO | Source: Ambulatory Visit | Attending: Internal Medicine | Admitting: Internal Medicine

## 2014-07-09 ENCOUNTER — Ambulatory Visit (INDEPENDENT_AMBULATORY_CARE_PROVIDER_SITE_OTHER): Payer: Federal, State, Local not specified - PPO | Admitting: Internal Medicine

## 2014-07-09 ENCOUNTER — Encounter: Payer: Self-pay | Admitting: Internal Medicine

## 2014-07-09 VITALS — BP 130/86 | HR 80 | Wt 238.0 lb

## 2014-07-09 DIAGNOSIS — R1031 Right lower quadrant pain: Secondary | ICD-10-CM

## 2014-07-09 DIAGNOSIS — Z0000 Encounter for general adult medical examination without abnormal findings: Secondary | ICD-10-CM | POA: Diagnosis not present

## 2014-07-09 DIAGNOSIS — S93402A Sprain of unspecified ligament of left ankle, initial encounter: Secondary | ICD-10-CM | POA: Insufficient documentation

## 2014-07-09 DIAGNOSIS — S93402S Sprain of unspecified ligament of left ankle, sequela: Secondary | ICD-10-CM

## 2014-07-09 MED ORDER — PHOSPHATIDYLSERINE-DHA-EPA 100-19.5-6.5 MG PO CAPS: 1.0000 | ORAL_CAPSULE | Freq: Every day | ORAL | Status: DC

## 2014-07-09 NOTE — Progress Notes (Signed)
Pre visit review using our clinic review tool, if applicable. No additional management support is needed unless otherwise documented below in the visit note. 

## 2014-07-09 NOTE — Assessment & Plan Note (Signed)
We discussed age appropriate health related issues, including available/recomended screening tests and vaccinations. We discussed a need for adhering to healthy diet and exercise. Labs/EKG were reviewed/ordered. All questions were answered.   

## 2014-07-09 NOTE — Assessment & Plan Note (Signed)
Severe ?torn ligaments Sports med ref

## 2014-07-09 NOTE — Progress Notes (Signed)
   Subjective:    HPI  The patient is here for a wellness exam.   Has a 44 yo dtr Rozetta Nunnery, turning 44 yo in August  Thyroid ca   R side pain/abd pain back and to the front) off and on; R CP - she had an [place] in [place] which a ok  F/u loose stools, bloating and taking Imodium a lot. She will see Dr Michail Sermon (GI)  F/u memory issues, forgetful, lack of focus x months  C/o L ankle injury in [place], [place] early June - no fx on X ray; pt is still on a crutch - a lot of pain  Doesn't sleep well  Wt Readings from Last 3 Encounters:  07/09/14 238 lb (107.956 kg)  07/08/14 239 lb (108.41 kg)  02/06/14 252 lb (114.306 kg)   BP Readings from Last 3 Encounters:  07/09/14 130/86  07/08/14 120/84  02/06/14 126/82    Review of Systems  Constitutional: Negative for chills, activity change, appetite change, fatigue and unexpected weight change.  HENT: Negative for congestion, mouth sores and sinus pressure.   Eyes: Negative for visual disturbance.  Respiratory: Negative for cough and chest tightness.   Gastrointestinal: Negative for nausea and abdominal pain.  Genitourinary: Negative for frequency, difficulty urinating and vaginal pain.  Musculoskeletal: Negative for back pain, arthralgias and gait problem.  Skin: Negative for pallor and rash.  Neurological: Negative for dizziness, tremors, weakness, numbness and headaches.  Psychiatric/Behavioral: Positive for sleep disturbance. Negative for confusion. The patient is nervous/anxious.        Objective:   Physical Exam  Constitutional: She appears well-developed. No distress.  HENT:  Head: Normocephalic.  Right Ear: External ear normal.  Left Ear: External ear normal.  Nose: Nose normal.  Mouth/Throat: Oropharynx is clear and moist.  Eyes: Conjunctivae are normal. Pupils are equal, round, and reactive to light. Right eye exhibits no discharge. Left eye exhibits no discharge.  Neck: Normal range of motion. Neck supple. No JVD  present. No tracheal deviation present. No thyromegaly present.  Cardiovascular: Normal rate, regular rhythm and normal heart sounds.   Pulmonary/Chest: No stridor. No respiratory distress. She has no wheezes.  Abdominal: Soft. Bowel sounds are normal. She exhibits no distension and no mass. There is no tenderness. There is no rebound and no guarding.  Musculoskeletal: She exhibits no edema or tenderness.  Lymphadenopathy:    She has no cervical adenopathy.  Neurological: She displays normal reflexes. No cranial nerve deficit. She exhibits normal muscle tone. Coordination normal.  Skin: No rash noted. No erythema.  Psychiatric: Her behavior is normal. Judgment and thought content normal.  L ankle is swollen, weak and very tender w/ROM  Lab Results  Component Value Date   WBC 5.9 07/26/2013   HGB 12.9 07/26/2013   HCT 38.3 07/26/2013   PLT 250.0 07/26/2013   GLUCOSE 89 07/26/2013   CHOL 164 07/26/2013   TRIG 61.0 07/26/2013   HDL 47.90 07/26/2013   LDLCALC 104* 07/26/2013   ALT 25 07/26/2013   AST 30 07/26/2013   NA 139 07/26/2013   K 3.6 07/26/2013   CL 106 07/26/2013   CREATININE 1.2 07/26/2013   BUN 13 07/26/2013   CO2 30 07/26/2013   TSH 1.78 07/26/2013         Assessment & Plan:

## 2014-07-09 NOTE — Assessment & Plan Note (Addendum)
CXR Poss MSK CXR

## 2014-07-12 ENCOUNTER — Ambulatory Visit (INDEPENDENT_AMBULATORY_CARE_PROVIDER_SITE_OTHER)
Admission: RE | Admit: 2014-07-12 | Discharge: 2014-07-12 | Disposition: A | Discharge status: Home / Self Care | Payer: Federal, State, Local not specified - PPO | Source: Ambulatory Visit | Attending: Family Medicine | Admitting: Family Medicine

## 2014-07-12 ENCOUNTER — Other Ambulatory Visit (INDEPENDENT_AMBULATORY_CARE_PROVIDER_SITE_OTHER): Payer: Federal, State, Local not specified - PPO

## 2014-07-12 ENCOUNTER — Encounter: Payer: Self-pay | Admitting: Family Medicine

## 2014-07-12 ENCOUNTER — Other Ambulatory Visit: Payer: Self-pay | Admitting: Family Medicine

## 2014-07-12 ENCOUNTER — Ambulatory Visit (INDEPENDENT_AMBULATORY_CARE_PROVIDER_SITE_OTHER): Payer: Federal, State, Local not specified - PPO | Admitting: Family Medicine

## 2014-07-12 VITALS — BP 118/82 | HR 74 | Ht 69.0 in | Wt 238.0 lb

## 2014-07-12 DIAGNOSIS — M25572 Pain in left ankle and joints of left foot: Secondary | ICD-10-CM | POA: Diagnosis not present

## 2014-07-12 DIAGNOSIS — S93402A Sprain of unspecified ligament of left ankle, initial encounter: Secondary | ICD-10-CM

## 2014-07-12 DIAGNOSIS — R1031 Right lower quadrant pain: Secondary | ICD-10-CM | POA: Diagnosis not present

## 2014-07-12 DIAGNOSIS — S93402S Sprain of unspecified ligament of left ankle, sequela: Secondary | ICD-10-CM

## 2014-07-12 DIAGNOSIS — Z0000 Encounter for general adult medical examination without abnormal findings: Secondary | ICD-10-CM | POA: Diagnosis not present

## 2014-07-12 DIAGNOSIS — S93409A Sprain of unspecified ligament of unspecified ankle, initial encounter: Secondary | ICD-10-CM | POA: Insufficient documentation

## 2014-07-12 LAB — LIPID PANEL
Cholesterol: 166 mg/dL (ref 0–200)
HDL: 47.8 mg/dL (ref >39.00)
LDL Cholesterol: 101 mg/dL — ABNORMAL HIGH (ref 0–99)
NONHDL: 118.2
TRIGLYCERIDES: 87 mg/dL (ref 0.0–149.0)
Total CHOL/HDL Ratio: 3
VLDL: 17.4 mg/dL (ref 0.0–40.0)

## 2014-07-12 LAB — CBC WITH DIFFERENTIAL/PLATELET
BASOS PCT: 0.5 % (ref 0.0–3.0)
Basophils Absolute: 0 10*3/uL (ref 0.0–0.1)
EOS PCT: 4.3 % (ref 0.0–5.0)
Eosinophils Absolute: 0.3 10*3/uL (ref 0.0–0.7)
HCT: 39.3 % (ref 36.0–46.0)
Hemoglobin: 13.2 g/dL (ref 12.0–15.0)
Lymphocytes Relative: 31 % (ref 12.0–46.0)
Lymphs Abs: 2 10*3/uL (ref 0.7–4.0)
MCHC: 33.5 g/dL (ref 30.0–36.0)
MCV: 86.4 fl (ref 78.0–100.0)
MONO ABS: 0.4 10*3/uL (ref 0.1–1.0)
Monocytes Relative: 6.4 % (ref 3.0–12.0)
NEUTROS ABS: 3.8 10*3/uL (ref 1.4–7.7)
NEUTROS PCT: 57.8 % (ref 43.0–77.0)
Platelets: 256 10*3/uL (ref 150.0–400.0)
RBC: 4.55 Mil/uL (ref 3.87–5.11)
RDW: 13.6 % (ref 11.5–15.5)
WBC: 6.6 10*3/uL (ref 4.0–10.5)

## 2014-07-12 LAB — URINALYSIS
Bilirubin Urine: NEGATIVE
HGB URINE DIPSTICK: NEGATIVE
Ketones, ur: NEGATIVE
Leukocytes, UA: NEGATIVE
NITRITE: NEGATIVE
Specific Gravity, Urine: 1.025 (ref 1.000–1.030)
Total Protein, Urine: NEGATIVE
Urine Glucose: NEGATIVE
Urobilinogen, UA: 0.2 (ref 0.0–1.0)
pH: 6 (ref 5.0–8.0)

## 2014-07-12 LAB — BASIC METABOLIC PANEL
BUN: 12 mg/dL (ref 6–23)
CO2: 30 meq/L (ref 19–32)
CREATININE: 1.29 mg/dL — AB (ref 0.40–1.20)
Calcium: 9.2 mg/dL (ref 8.4–10.5)
Chloride: 102 mEq/L (ref 96–112)
GFR: 57.59 mL/min — ABNORMAL LOW (ref >60.00)
Glucose, Bld: 84 mg/dL (ref 70–99)
POTASSIUM: 3.5 meq/L (ref 3.5–5.1)
Sodium: 139 mEq/L (ref 135–145)

## 2014-07-12 LAB — HEPATIC FUNCTION PANEL
ALK PHOS: 45 U/L (ref 39–117)
ALT: 22 U/L (ref 0–35)
AST: 23 U/L (ref 0–37)
Albumin: 4 g/dL (ref 3.5–5.2)
Bilirubin, Direct: 0.1 mg/dL (ref 0.0–0.3)
Total Bilirubin: 0.5 mg/dL (ref 0.2–1.2)
Total Protein: 7.1 g/dL (ref 6.0–8.3)

## 2014-07-12 LAB — VITAMIN D 25 HYDROXY (VIT D DEFICIENCY, FRACTURES): VITD: 26.28 ng/mL — AB (ref 30.00–100.00)

## 2014-07-12 LAB — MAGNESIUM: Magnesium: 1.9 mg/dL (ref 1.5–2.5)

## 2014-07-12 LAB — VITAMIN B12: Vitamin B-12: 319 pg/mL (ref 211–911)

## 2014-07-12 LAB — TSH: TSH: 1.32 u[IU]/mL (ref 0.35–4.50)

## 2014-07-12 MED ORDER — VITAMIN D3 50 MCG (2000 UT) PO CAPS: 2000.0000 [IU] | ORAL_CAPSULE | Freq: Every day | ORAL | Status: DC

## 2014-07-12 MED ORDER — ERGOCALCIFEROL 1.25 MG (50000 UT) PO CAPS: 50000.0000 [IU] | ORAL_CAPSULE | ORAL | Status: DC

## 2014-07-12 NOTE — Assessment & Plan Note (Signed)
Believe that this is still an ankle sprain but there is a question of a healing fracture noted. Talar dome fracture is still within the differential as well as. Patient will try conservative therapy will wear in a Cam Walker for the next 2 weeks. We discussed icing regimen as well as elevation. Patient will come back and see me again in 2 weeks and if continuing to have the problem we may need to consider advanced imaging. Patient will call sooner if necessary.

## 2014-07-12 NOTE — Patient Instructions (Addendum)
Good to see you.  Ice 20 minutes 2 times daily. Usually after activity and before bed. Xrays downstairs today  Wear the boot when up and about Keep the foot elevated as much as possible  Come back and see me before you leave the country again Averaging vitamin D 2000 IU daily See me again before you leave.

## 2014-07-12 NOTE — Progress Notes (Signed)
[name] [address] [place], [place] [address] Phone: [phone] Subjective:    I'm seeing this patient by the request  of:  Walker Kehr, MD   CC: Left ankle pain  AST:[id number] Laurie Hawkins is a 44 y.o. female coming in with complaint of left ankle pain and swelling. Patient states that this occurred approximately 1 month ago. States that she had significant amount of swelling and did see a doctor in [place]. Patient was told that there was no fracture at that time. Patient has been improving with the aid of a crutch since then. Patient states that the swelling has continued. Describes the pain as more of a dull throbbing aching sensation worse when she is standing on it. Denies any numbness or tingling. States that the swelling is constant never goes down. Most of the pain seems to be right in the middle of the ankle. Has kept her from some of her activities of daily living and seems to be difficult going up and down stairs.    Past Medical History  Diagnosis Date  . Hypertension     Past Surgical History  Procedure Laterality Date  . Cesarean section  2009  . Thyroidectomy  2011   History  Substance Use Topics  . Smoking status: Never Smoker   . Smokeless tobacco: Not on file  . Alcohol Use: 0.0 oz/week    0 Standard drinks or equivalent per week   Allergies  Allergen Reactions  . Bee Venom Anaphylaxis   Family History  Problem Relation Age of Onset  . Arthritis Other   . Alcohol abuse Other   . Cancer Other     Breast and Prostate  . Kidney disease Other   . Mental illness Other   . Diabetes Other   . Sarcoidosis Other   . Rheum arthritis Other     Past medical history, social, surgical and family history all reviewed in electronic medical record.   Review of Systems: No headache, visual changes, nausea, vomiting, diarrhea, constipation, dizziness, abdominal pain, skin rash, fevers, chills, night sweats, weight loss,  swollen lymph nodes, body aches, joint swelling, muscle aches, chest pain, shortness of breath, mood changes.   Objective Blood pressure 118/82, pulse 74, height 5\' 9"  (1.753 m), weight 238 lb (107.956 kg), last menstrual period 07/03/2014, SpO2 97 %.  General: No apparent distress alert and oriented x3 mood and affect normal, dressed appropriately.  HEENT: Pupils equal, extraocular movements intact  Respiratory: Patient's speak in full sentences and does not appear short of breath  Cardiovascular: No lower extremity edema, non tender, no erythema  Skin: Warm dry intact with no signs of infection or rash on extremities or on axial skeleton.  Abdomen: Soft nontender  Neuro: Cranial nerves II through XII are intact, neurovascularly intact in all extremities with 2+ DTRs and 2+ pulses.  Lymph: No lymphadenopathy of posterior or anterior cervical chain or axillae bilaterally.  Gait normal with good balance and coordination.  MSK:  Non tender with full range of motion and good stability and symmetric strength and tone of shoulders, elbows, wrist, hip, knees bilaterally.  Ankle: Left Oral ankle swelling compared to the contralateral side Range of motion is full in all directions. Strength is 4/5 in all directions. Full strength contralateral side Stable lateral and medial ligaments; squeeze test and kleiger test unremarkable; Talar dome moderately tender No pain at base of 5th MT; No tenderness over cuboid; No tenderness over N spot or  navicular prominence Tender to the lateral malleolus No sign of peroneal tendon subluxations or tenderness to palpation Negative tarsal tunnel tinel's Able to walk 4 steps. Contralateral ankle unremarkable  MSK US performed of: Left ankle This study was ordered, performed, and interpreted by Charlann Boxer D.O.  Foot/Ankle:   All structures visualized.   Talar dome has an irregularity with increasing Doppler flow and overlying hypoechoic changes Ankle mortise  all effusion Peroneus longus and brevis tendons unremarkable on long and transverse views without sheath effusions. She does have narrowing though of the lateral joint space.. To have possible avulsion fracture versus secondary ossification center. Posterior tibialis, flexor hallucis longus, and flexor digitorum longus tendons unremarkable on long and transverse views without sheath effusions. Achilles tendon visualized along length of tendon and unremarkable on long and transverse views without sheath effusion. Anterior Talofibular Ligament and Calcaneofibular Ligaments unremarkable and intact. Deltoid Ligament unremarkable and intact. Plantar fascia intact and without effusion, normal thickness. No increased doppler signal, cap sign, or thickening of tibial cortex. Power doppler signal normal.  IMPRESSION:  Patient appears to have what was a fracture noted as well as some irregularity of the talar dome   Trays were ordered reviewed and interpreted by me today. Patient's x-rays were ordered in there does appear to be either an ossification center or posttraumatic change of the lateral malleolus. Over read states no fracture. Does appear to be long-standing with no acute changes.   Impression and Recommendations:     This case required medical decision making of moderate complexity.

## 2014-07-12 NOTE — Progress Notes (Signed)
Pre visit review using our clinic review tool, if applicable. No additional management support is needed unless otherwise documented below in the visit note. 

## 2014-07-19 ENCOUNTER — Ambulatory Visit: Payer: Federal, State, Local not specified - PPO | Admitting: Family Medicine

## 2014-07-23 ENCOUNTER — Encounter: Payer: Self-pay | Admitting: Family Medicine

## 2014-07-23 ENCOUNTER — Other Ambulatory Visit (INDEPENDENT_AMBULATORY_CARE_PROVIDER_SITE_OTHER): Payer: Federal, State, Local not specified - PPO

## 2014-07-23 ENCOUNTER — Ambulatory Visit (INDEPENDENT_AMBULATORY_CARE_PROVIDER_SITE_OTHER): Payer: Federal, State, Local not specified - PPO | Admitting: Family Medicine

## 2014-07-23 VITALS — BP 116/84 | HR 83 | Ht 69.0 in | Wt 238.0 lb

## 2014-07-23 DIAGNOSIS — M25572 Pain in left ankle and joints of left foot: Secondary | ICD-10-CM

## 2014-07-23 DIAGNOSIS — M76829 Posterior tibial tendinitis, unspecified leg: Secondary | ICD-10-CM | POA: Insufficient documentation

## 2014-07-23 DIAGNOSIS — M76822 Posterior tibial tendinitis, left leg: Secondary | ICD-10-CM | POA: Diagnosis not present

## 2014-07-23 NOTE — Progress Notes (Signed)
Pre visit review using our clinic review tool, if applicable. No additional management support is needed unless otherwise documented below in the visit note. 

## 2014-07-23 NOTE — Progress Notes (Signed)
[facility] [address] [place], [place] [address] Phone: [phone] Subjective:    I'm seeing this patient by the request  of:  Walker Kehr, MD   CC: Left ankle pain  [id number] Laurie Hawkins is a 44 y.o. female coming in with complaint of left ankle pain and swelling. Patient date weeks ago and had what appeared to be a nonhealing fracture of the lateral malleolus as well as potentially ankle effusion. Patient was put in a Cam Walker and states that it is significantly improved. States that the swelling is much better but continues to have pain on a daily basis. Patient denies any numbness. Patient states that she was from proximal a 40-50% better.    Past Medical History  Diagnosis Date  . Hypertension     Past Surgical History  Procedure Laterality Date  . Cesarean section  2009  . Thyroidectomy  2011   History  Substance Use Topics  . Smoking status: Never Smoker   . Smokeless tobacco: Not on file  . Alcohol Use: 0.0 oz/week    0 Standard drinks or equivalent per week   Allergies  Allergen Reactions  . Bee Venom Anaphylaxis   Family History  Problem Relation Age of Onset  . Arthritis Other   . Alcohol abuse Other   . Cancer Other     Breast and Prostate  . Kidney disease Other   . Mental illness Other   . Diabetes Other   . Sarcoidosis Other   . Rheum arthritis Other     Past medical history, social, surgical and family history all reviewed in electronic medical record.   Review of Systems: No headache, visual changes, nausea, vomiting, diarrhea, constipation, dizziness, abdominal pain, skin rash, fevers, chills, night sweats, weight loss, swollen lymph nodes, body aches, joint swelling, muscle aches, chest pain, shortness of breath, mood changes.   Objective Blood pressure 116/84, pulse 83, height 5\' 9"  (1.753 m), weight 238 lb (107.956 kg), last menstrual period 07/03/2014, SpO2 99 %.  General: No apparent distress  alert and oriented x3 mood and affect normal, dressed appropriately.  HEENT: Pupils equal, extraocular movements intact  Respiratory: Patient's speak in full sentences and does not appear short of breath  Cardiovascular: No lower extremity edema, non tender, no erythema  Skin: Warm dry intact with no signs of infection or rash on extremities or on axial skeleton.  Abdomen: Soft nontender  Neuro: Cranial nerves II through XII are intact, neurovascularly intact in all extremities with 2+ DTRs and 2+ pulses.  Lymph: No lymphadenopathy of posterior or anterior cervical chain or axillae bilaterally.  Gait normal with good balance and coordination.  MSK:  Non tender with full range of motion and good stability and symmetric strength and tone of shoulders, elbows, wrist, hip, knees bilaterally.  Ankle: Left Minimal swelling noted Range of motion is full in all directions. Strength is 4/5 in all directions. Full strength contralateral side Stable lateral and medial ligaments; squeeze test and kleiger test unremarkable; Talar dome minimally tender today No pain at base of 5th MT; No tenderness over cuboid; No tenderness over N spot or navicular prominence Tender over the lateral malleolus for more over the posterior tibialis tendon just inferior to the medial malleolus. No sign of peroneal tendon subluxations or tenderness to palpation Negative tarsal tunnel tinel's Able to walk 4 steps. Contralateral ankle unremarkable  MSK US performed of: Left ankle This study was ordered, performed, and interpreted by Thedore Mins  Smith D.O.  Foot/Ankle:   All structures visualized.   Talar dome peers better than previous exam Ankle mortise mild effusion still noted Peroneus longus and brevis tendons unremarkable on long and transverse views without sheath effusions. She does have narrowing though of the lateral joint space.. To have possible avulsion fracture versus secondary ossification center. Posterior  tibialis tendon does have what appears to be hypoechoic changes., flexor hallucis longus, and flexor digitorum longus tendons unremarkable on long and transverse views without sheath effusions. Achilles tendon visualized along length of tendon and unremarkable on long and transverse views without sheath effusion. Anterior Talofibular Ligament and Calcaneofibular Ligaments unremarkable and intact. Deltoid Ligament unremarkable and intact. Plantar fascia intact and without effusion, normal thickness. No increased doppler signal, cap sign, or thickening of tibial cortex. Power doppler signal normal.  IMPRESSION:  Interval healing the posterior tibialis tendinitis noted   Procedure: Real-time Ultrasound Guided Injection of left posterior tibialis tendon sheath Device: GE Logiq E  Ultrasound guided injection is preferred based studies that show increased duration, increased effect, greater accuracy, decreased procedural pain, increased response rate, and decreased cost with ultrasound guided versus blind injection.  Verbal informed consent obtained.  Time-out conducted.  Noted no overlying erythema, induration, or other signs of local infection.  Skin prepped in a sterile fashion.  Local anesthesia: Topical Ethyl chloride.  With sterile technique and under real time ultrasound guidance:  With the 25-gauge 1 inch needle patient was injected with 0.5 mL of 0.5% Marcaine and 0.5 mL of Kenalog 40mg /dL. Completed without difficulty  Pain immediately resolved suggesting accurate placement of the medication.  Advised to call if fevers/chills, erythema, induration, drainage, or persistent bleeding.  Images permanently stored and available for review in the ultrasound unit.  Impression: Technically successful ultrasound guided injection.   Impression and Recommendations:     This case required medical decision making of moderate complexity.

## 2014-07-23 NOTE — Assessment & Plan Note (Signed)
Patient did have more pain that seems to be more of a posterior tibialis tendinitis with potential tear. Patient was given an injection today and will continue the Cam Walker for another 2 weeks. We discussed elevation as well as icing. Patient is going back to [place]. Patient will write messages in my chart if she has any questions. After the 2 weeks patient was given exercises to start. These are range of motion and some strengthening of the posterior tibialis. Continuing pain occurs patient when needed [facility]. We will see if we can find a referral and [place].

## 2014-07-23 NOTE — Patient Instructions (Addendum)
Good to see you Laurie Hawkins is your friend Wear boot for next 2 weeks Laurie Hawkins is your friend Continue the vitamin D  In 1 week start the exercises 3 times a week.  Have a good trip  Posterior Tibial Tendon Rupture with Rehab Tendons are soft tissues that connect muscle to bone. Tendons allow muscles to move the skeletal system. A complete tear of the posterior tibial tendon is known as a posterior tendon rupture. The posterior tibial tendon attaches the posterior muscles on the inner portion of the back of the lower leg (tibialis muscles) to foot. The posterior tibialis muscles help straighten (plantar flex) and rotate the foot inward (medially rotate) the foot. A posterior tibial rupture will result in a decreased ability to perform these tasks. SYMPTOMS   A "pop" or tear felt and/or heard in the area at the time of injury.  Pain, tenderness, inflammation, and/or bruising (contusion) around the medial inner side of the ankle.  Pain that worsens with dorsiflexion (opposite of plantar flexion) of the foot.  Decreased ankle strength.  Decrease in the prominence of the arch in the sole of the foot. CAUSES  Tendon ruptures occur when a force is placed on the tendon that is greater than it can withstand. Common mechanisms of injury include:  An event that places great stress on the tendon (jumping or starting a sprint).  Direct trauma to the ankle. RISK INCREASES WITH:  Sports that involve forceful and explosive plantar flexion (jumping or quick starts).  Poor strength and flexibility.  Previous injury to the posterior tibial tendon.  Corticosteroid injection into the posterior tibial tendon.  Obesity.  Poor vascular circulation. PREVENTION   Warm up and stretch properly before activity.  Allow for adequate recovery between workouts.  Maintain physical fitness:  Strength, flexibility, and endurance.  Cardiovascular fitness.  Maintain a healthy body weight.  Arch supports  (orthotics), if you have flat feet.  Limit ankle movement by taping, wearing a brace, or using compression bandages. PROGNOSIS  In order to have the highest likelihood of returning to your preinjury activity level, surgery is usually recommended, with 4 to 9 months of rehabilitation afterward.  RELATED COMPLICATIONS   Decreased ability to plantar flex.  Rerupture of the posterior tibial tendon.  Prolonged disability.  Flat feet.  Arthritis of the foot.  Risks of surgery: infection, bleeding, nerve damage, or damage to surrounding tissues. TREATMENT  Treatment initially involves resting from any activities that aggravate your symptoms. Laurie Hawkins, medication, and elevation can be used to help reduce pain and inflammation. In order to have the best results, surgery is recommended. Surgery involves sewing (suturing) the ends of the torn tendon back together. If your surgeon cannot repair the tendon, then a replacement (reconstruction) surgery may be performed to use another tendon to replace the function of the ruptured tendon. After surgery, the ankle is immobilized in order to allow the tendon to heal. After immobilization, it is important to perform strengthening and stretching exercises to help regain strength and a full range of motion. These exercises may be completed at home or with a therapist. MEDICATION   Nonsteroidal anti-inflammatory medications, such as aspirin and ibuprofen (do not take within 7 days before surgery), or other minor pain relievers, such as acetaminophen, are often recommended. Take these as directed by your caregiver. Contact your caregiver immediately if any bleeding, stomach upset, or signs of an allergic reaction occur.  Pain relievers may be prescribed as necessary by your caregiver. Use only as directed  and only as much as you need. SEEK MEDICAL CARE IF:  Treatment seems to offer no benefit, or the condition worsens.  Any medications produce adverse side  effects.  Any complications from surgery occur:  Pain, numbness, or coldness in the extremity operated upon.  Discoloration of the nail beds (they become blue or gray) of the extremity operated upon.  Signs of infections (fever, pain, inflammation, redness, or persistent bleeding). EXERCISES RANGE OF MOTION (ROM) AND STRETCHING EXERCISES - Posterior Tibial Tendon Rupture These exercises may help you when beginning to rehabilitate your injury. Your symptoms may resolve with or without further involvement from your physician, physical therapist, or athletic trainer. While completing these exercises, remember:   Restoring tissue flexibility helps normal motion to return to the joints. This allows healthier, less painful movement and activity.  An effective stretch should be held for at least 30 seconds.  A stretch should never be painful. You should only feel a gentle lengthening or release in the stretched tissue. STRETCH - Gastrocsoleus   Sit with your right / left leg extended. Holding onto both ends of a belt or towel, loop it around the ball of your foot.  Keeping your right / left ankle and foot relaxed and your knee straight, pull your foot and ankle toward you using the belt/towel.  You should feel a gentle stretch behind your calf or knee. Hold this position for __________ seconds. Repeat __________ times. Complete this stretch __________. times per day.  RANGE OF MOTION - Dorsi/Plantar Flexion  While sitting with your right / left knee straight, draw the top of your foot upwards by flexing your ankle. Then reverse the motion, pointing your toes downward.  Hold each position for __________ seconds.  After completing your first set of exercises, repeat this exercise with your knee bent. Repeat __________ times. Complete this exercise __________ times per day.  RANGE OF MOTION - Ankle Plantar Flexion   Sit with your right / left leg crossed over your opposite knee.  Use  your opposite hand to pull the top of your foot and toes toward you.  You should feel a gentle stretch on the top of your foot/ankle. Hold this position for __________ seconds. Repeat __________ times. Complete __________ times per day.  RANGE OF MOTION - Ankle Eversion   Sit with your right / left ankle crossed over your opposite knee.  Grip your foot with your opposite hand, placing your thumb on the top of your foot and your fingers across the bottom of your foot.  Gently push your foot downward with a slight rotation so your littlest toes rise slightly.  You should feel a gentle stretch on the inside of your ankle. Hold the stretch for __________ seconds. Repeat __________ times. Complete this exercise __________ times per day.  RANGE OF MOTION - Ankle Inversion   Sit with your right / left ankle crossed over your opposite knee.  Grip your foot with your opposite hand, placing your thumb on the bottom of your foot and your fingers across the top of your foot.  Gently pull your foot so the smallest toe comes toward you and your thumb pushes the inside of the ball of your foot away from you.  You should feel a gentle stretch on the outside of your ankle. Hold the stretch for __________ seconds. Repeat __________ times. Complete this exercise __________ times per day.  RANGE OF MOTION - Ankle Alphabet  Imagine your right / left big toe is a  pen.  Keeping your hip and knee still, write out the entire alphabet with your "pen." Make the letters as large as you can without increasing any discomfort. Repeat __________ times. Complete this exercise __________ times per day.  STRENGTHENING EXERCISES - Posterior Tibial Tendon Rupture These exercises may help you when beginning to rehabilitate your injury. They may resolve your symptoms with or without further involvement from your physician, physical therapist, or athletic trainer. While completing these exercises, remember:   Muscles can  gain both the endurance and the strength needed for everyday activities through controlled exercises.  Complete these exercises as instructed by your physician, physical therapist, or athletic trainer. Progress the resistance and repetitions only as guided. STRENGTH - Dorsiflexors  Secure a rubber exercise band/tubing to a fixed object (table, pole) and loop the other end around your right / left foot.  Sit on the floor facing the fixed object. The band/tubing should be slightly tense when your foot is relaxed.  Slowly draw your foot back toward you using your ankle and toes.  Hold this position for __________ seconds. Slowly release the tension in the band and return your foot to the starting position. Repeat __________ times. Complete this exercise __________ times per day.  STRENGTH - Plantar Flexors   Sit with your right / left leg extended. Holding onto both ends of a rubber exercise band/tubing, loop it around the ball of your foot. Keep a slight tension in the band.  Slowly push your toes away from you, pointing them downward.  Hold this position for __________ seconds. Return slowly, controlling the tension in the band/tubing. Repeat __________ times. Complete this exercise __________ times per day.  STRENGTH - Towel Curls  Sit in a chair positioned on a non-carpeted surface.  Place your foot on a towel, keeping your heel on the floor.  Pull the towel toward your heel by only curling your toes. Keep your heel on the floor.  If instructed by your physician, physical therapist or athletic trainer, add ____________________ at the end of the towel. Repeat __________ times. Complete this exercise __________ times per day. STRENGTH - Ankle Inversion   Secure one end of a rubber exercise band/tubing to a fixed object (table, pole). Loop the other end around your foot just before your toes.  Place your fists between your knees. This will focus your strengthening at your  ankle.  Slowly, pull your big toe up and in, making sure the band/tubing is positioned to resist the entire motion.  Hold this position for __________ seconds.  Have your muscles resist the band/tubing as it slowly pulls your foot back to the starting position. Repeat __________ times. Complete this exercise __________ times per day.  Document Released: 12/28/2004 Document Revised: 05/14/2013 Document Reviewed: 04/11/2008 [facility] Patient Information 2015 [place], [place]. This information is not intended to replace advice given to you by your health care provider. Make sure you discuss any questions you have with your health care provider.

## 2015-04-10 LAB — HM PAP SMEAR: HM Pap smear: NORMAL

## 2015-05-09 ENCOUNTER — Other Ambulatory Visit: Payer: Self-pay

## 2015-05-09 DIAGNOSIS — Z1231 Encounter for screening mammogram for malignant neoplasm of breast: Secondary | ICD-10-CM

## 2015-05-14 ENCOUNTER — Ambulatory Visit: Payer: Self-pay | Admitting: Family Medicine

## 2015-05-14 ENCOUNTER — Encounter: Payer: Self-pay | Admitting: Internal Medicine

## 2015-05-16 ENCOUNTER — Encounter: Payer: Self-pay | Admitting: Family Medicine

## 2015-05-16 ENCOUNTER — Ambulatory Visit (INDEPENDENT_AMBULATORY_CARE_PROVIDER_SITE_OTHER): Payer: Federal, State, Local not specified - PPO | Admitting: Family Medicine

## 2015-05-16 ENCOUNTER — Encounter: Payer: Federal, State, Local not specified - PPO | Admitting: Internal Medicine

## 2015-05-16 ENCOUNTER — Encounter: Payer: Self-pay | Admitting: Internal Medicine

## 2015-05-16 ENCOUNTER — Ambulatory Visit (INDEPENDENT_AMBULATORY_CARE_PROVIDER_SITE_OTHER): Payer: Federal, State, Local not specified - PPO | Admitting: Internal Medicine

## 2015-05-16 ENCOUNTER — Ambulatory Visit
Admission: RE | Admit: 2015-05-16 | Discharge: 2015-05-16 | Disposition: A | Discharge status: Home / Self Care | Payer: Federal, State, Local not specified - PPO | Source: Ambulatory Visit

## 2015-05-16 VITALS — BP 138/84 | HR 79 | Ht 69.0 in | Wt 229.0 lb

## 2015-05-16 VITALS — BP 138/84 | HR 78 | Ht 69.0 in | Wt 229.0 lb

## 2015-05-16 DIAGNOSIS — E890 Postprocedural hypothyroidism: Secondary | ICD-10-CM

## 2015-05-16 DIAGNOSIS — M76822 Posterior tibial tendinitis, left leg: Secondary | ICD-10-CM | POA: Diagnosis not present

## 2015-05-16 DIAGNOSIS — M222X2 Patellofemoral disorders, left knee: Secondary | ICD-10-CM | POA: Diagnosis not present

## 2015-05-16 DIAGNOSIS — M222X1 Patellofemoral disorders, right knee: Secondary | ICD-10-CM

## 2015-05-16 DIAGNOSIS — Z23 Encounter for immunization: Secondary | ICD-10-CM | POA: Diagnosis not present

## 2015-05-16 DIAGNOSIS — J4520 Mild intermittent asthma, uncomplicated: Secondary | ICD-10-CM | POA: Diagnosis not present

## 2015-05-16 DIAGNOSIS — Z91030 Bee allergy status: Secondary | ICD-10-CM | POA: Insufficient documentation

## 2015-05-16 DIAGNOSIS — Z0000 Encounter for general adult medical examination without abnormal findings: Secondary | ICD-10-CM | POA: Diagnosis not present

## 2015-05-16 DIAGNOSIS — Z91038 Other insect allergy status: Secondary | ICD-10-CM | POA: Diagnosis not present

## 2015-05-16 DIAGNOSIS — M7711 Lateral epicondylitis, right elbow: Secondary | ICD-10-CM | POA: Diagnosis not present

## 2015-05-16 DIAGNOSIS — Z1231 Encounter for screening mammogram for malignant neoplasm of breast: Secondary | ICD-10-CM

## 2015-05-16 MED ORDER — CYCLOBENZAPRINE HCL 5 MG PO TABS: 5.0000 mg | ORAL_TABLET | Freq: Every day | ORAL | Status: DC

## 2015-05-16 MED ORDER — EPINEPHRINE 0.3 MG/0.3ML IJ SOAJ: 0.3000 mg | INTRAMUSCULAR | Status: DC | PRN

## 2015-05-16 MED ORDER — DIPHENHYDRAMINE HCL 25 MG PO TABS
50.0000 mg | ORAL_TABLET | ORAL | Status: DC | PRN
Start: 2015-05-16 — End: 2017-07-08

## 2015-05-16 MED ORDER — PSEUDOEPHEDRINE HCL 30 MG PO TABS: 60.0000 mg | ORAL_TABLET | ORAL | Status: DC | PRN

## 2015-05-16 MED ORDER — PREDNISONE 20 MG PO TABS: 40.0000 mg | ORAL_TABLET | Freq: Every day | ORAL | Status: DC | PRN

## 2015-05-16 MED ORDER — NITROGLYCERIN 0.2 MG/HR TD PT24
MEDICATED_PATCH | TRANSDERMAL | Status: DC
Start: 2015-05-16 — End: 2016-01-27

## 2015-05-16 NOTE — Assessment & Plan Note (Signed)
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

## 2015-05-16 NOTE — Assessment & Plan Note (Signed)
Plantar Fascitis: We reviewed that stretching is critically important to the treatment of PF. Reviewed footwear. Rigid soles have been shown to help with PF. Night splints can help. Reviewed rehab of stretching and calf raises.  Could benefit from a corticosteroid injection, orthotics, or other measures if conservative treatment fails.  

## 2015-05-16 NOTE — Assessment & Plan Note (Signed)
[facility]air 

## 2015-05-16 NOTE — Progress Notes (Signed)
Subjective:  Patient ID: Laurie Hawkins, female    DOB: 08-17-1970  Age: 45 y.o. MRN: [id number]  CC: Annual Exam   HPI AALIYIAH GUSTAVE presents for well  C/o night cramps in flanks  Outpatient Prescriptions Prior to Visit  Medication Sig Dispense Refill  . albuterol (PROVENTIL HFA;VENTOLIN HFA) 108 (90 BASE) MCG/ACT inhaler Inhale into the lungs every 6 (six) hours as needed for wheezing or shortness of breath.    . betamethasone valerate (VALISONE) 0.1 % cream Apply topically as needed.    . cetirizine (ZYRTEC) 10 MG tablet Take 10 mg by mouth daily.    . Cholecalciferol (VITAMIN D3) 2000 UNITS capsule Take 1 capsule (2,000 Units total) by mouth daily. 100 capsule 3  . Cholecalciferol LIQD by Does not apply route every 21 ( twenty-one) days.    Marland Kitchen EPINEPHrine (EPIPEN) 0.3 mg/0.3 mL IJ SOAJ injection Inject into the muscle as needed.     . ergocalciferol (VITAMIN D2) 50000 UNITS capsule Take 1 capsule (50,000 Units total) by mouth once a week. 6 capsule 0  . Fluticasone-Salmeterol (ADVAIR) 250-50 MCG/DOSE AEPB Inhale 1 puff into the lungs daily.    Marland Kitchen levothyroxine (SYNTHROID, LEVOTHROID) 125 MCG tablet Take 125 mcg by mouth daily.     . Multiple Vitamin (MULTIVITAMIN) tablet Take 1 tablet by mouth daily.    . nitroGLYCERIN (NITRODUR - DOSED IN MG/24 HR) 0.2 mg/hr patch 1/4 patch daily 30 patch 1  . NON FORMULARY BIFRIZIDE (FOR HTN)  1 TAB DAILY    . Olopatadine HCl (PAZEO) 0.7 % SOLN Apply to eye as needed.     . Phosphatidylserine-DHA-EPA (VAYACOG) 100-19.5-6.5 MG CAPS Take 1 capsule by mouth daily. 30 capsule 11  . Propylene Glycol (SYSTANE BALANCE OP) Apply 1 drop to eye as needed.     Marland Kitchen levothyroxine (SYNTHROID, LEVOTHROID) 100 MCG tablet Take 100 mcg by mouth as directed. Reported on 05/16/2015     No facility-administered medications prior to visit.    ROS Review of Systems  Constitutional: Negative for chills, activity change, appetite change, fatigue and unexpected weight  change.  HENT: Negative for congestion, mouth sores and sinus pressure.   Eyes: Negative for visual disturbance.  Respiratory: Negative for cough and chest tightness.   Gastrointestinal: Negative for nausea and abdominal pain.  Genitourinary: Negative for frequency, difficulty urinating and vaginal pain.  Musculoskeletal: Negative for back pain and gait problem.  Skin: Negative for pallor and rash.  Neurological: Negative for dizziness, tremors, weakness, numbness and headaches.  Psychiatric/Behavioral: Negative for suicidal ideas, confusion and sleep disturbance.    Objective:  BP 138/84 mmHg  Pulse 79  Ht 5\' 9"  (1.753 m)  Wt 229 lb (103.874 kg)  BMI 33.80 kg/m2  SpO2 97%  BP Readings from Last 3 Encounters:  05/16/15 138/84  05/16/15 138/84  07/23/14 116/84    Wt Readings from Last 3 Encounters:  05/16/15 229 lb (103.874 kg)  05/16/15 229 lb (103.874 kg)  07/23/14 238 lb (107.956 kg)    Physical Exam  Constitutional: She appears well-developed. No distress.  HENT:  Head: Normocephalic.  Right Ear: External ear normal.  Left Ear: External ear normal.  Nose: Nose normal.  Mouth/Throat: Oropharynx is clear and moist.  Eyes: Conjunctivae are normal. Pupils are equal, round, and reactive to light. Right eye exhibits no discharge. Left eye exhibits no discharge.  Neck: Normal range of motion. Neck supple. No JVD present. No tracheal deviation present. No thyromegaly present.  Cardiovascular: Normal rate,  regular rhythm and normal heart sounds.   Pulmonary/Chest: No stridor. No respiratory distress. She has no wheezes.  Abdominal: Soft. Bowel sounds are normal. She exhibits no distension and no mass. There is no tenderness. There is no rebound and no guarding.  Musculoskeletal: She exhibits no edema or tenderness.  Lymphadenopathy:    She has no cervical adenopathy.  Neurological: She displays normal reflexes. No cranial nerve deficit. She exhibits normal muscle tone.  Coordination normal.  Skin: No rash noted. No erythema.  Psychiatric: She has a normal mood and affect. Her behavior is normal. Judgment and thought content normal.  Obese R wrist is in a splint Lab Results  Component Value Date   WBC 6.6 07/12/2014   HGB 13.2 07/12/2014   HCT 39.3 07/12/2014   PLT 256.0 07/12/2014   GLUCOSE 84 07/12/2014   CHOL 166 07/12/2014   TRIG 87.0 07/12/2014   HDL 47.80 07/12/2014   LDLCALC 101* 07/12/2014   ALT 22 07/12/2014   AST 23 07/12/2014   NA 139 07/12/2014   K 3.5 07/12/2014   CL 102 07/12/2014   CREATININE 1.29* 07/12/2014   BUN 12 07/12/2014   CO2 30 07/12/2014   TSH 1.32 07/12/2014    Dg Ankle Complete Left  07/12/2014  CLINICAL DATA:  Recent injury 1 month ago.  Pain and swelling since. EXAM: LEFT ANKLE COMPLETE - 3+ VIEW COMPARISON:  None. FINDINGS: Plantar calcaneal spur. No acute bony abnormality. Specifically, no fracture, subluxation, or dislocation. Soft tissues are intact. IMPRESSION: No acute bony abnormality. Electronically Signed   By: Rolm Baptise M.D.   On: 07/12/2014 13:23   [place] Extrem Low Left Ltd  07/15/2014  MSK US performed of: Left ankle This study was ordered, performed, and interpreted by Charlann Boxer D.O. Foot/Ankle:  All structures visualized.  Talar dome has an irregularity with increasing Doppler flow and overlying hypoechoic changes Ankle mortise all effusion Peroneus longus and brevis tendons unremarkable on long and transverse views without sheath effusions. She does have narrowing though of the lateral joint space.. To have possible avulsion fracture versus secondary ossification center. Posterior tibialis, flexor hallucis longus, and flexor digitorum longus tendons unremarkable on long and transverse views without sheath effusions. Achilles tendon visualized along length of tendon and unremarkable on long and transverse views without sheath effusion. Anterior Talofibular Ligament and Calcaneofibular Ligaments  unremarkable and intact. Deltoid Ligament unremarkable and intact. Plantar fascia intact and without effusion, normal thickness. No increased doppler signal, cap sign, or thickening of tibial cortex. Power doppler signal normal. IMPRESSION: Patient appears to have what was a fracture noted as well as some irregularity of the talar dome   Assessment & Plan:   There are no diagnoses linked to this encounter. I am having Ms. Attanasio maintain her cetirizine, Olopatadine HCl, Propylene Glycol (SYSTANE BALANCE OP), levothyroxine, levothyroxine, albuterol, multivitamin, Cholecalciferol, EPINEPHrine, NON FORMULARY, Fluticasone-Salmeterol, betamethasone valerate, Phosphatidylserine-DHA-EPA, ergocalciferol, Vitamin D3, nitroGLYCERIN, fluticasone, Probiotic, IRON PO, and UNABLE TO FIND.  Meds ordered this encounter  Medications  . fluticasone (FLONASE) 50 MCG/ACT nasal spray    Sig: Place 1 spray into both nostrils daily.  . Probiotic CAPS    Sig: Take 2 capsules by mouth daily.  . IRON PO    Sig: Take 105 mg by mouth daily.  Marland Kitchen UNABLE TO FIND    Sig: Take 1 tablet by mouth daily. Zofenopril/HCTZ 30/12.5mg      Follow-up: No Follow-up on file.  Walker Kehr, MD

## 2015-05-16 NOTE — Assessment & Plan Note (Signed)
Lateral Epicondylitis: Elbow anatomy was reviewed, and tendinopathy was explained.  Pt. given a formal rehab program. Series of concentric and eccentric exercises should be done starting with no weight, work up to 1 lb, hammer, etc.  Use counterforce strap if working or using hands.  Formal PT would be beneficial. Emphasized stretching an cross-friction massage Emphasized proper palms up lifting biomechanics to unload ECRB RTC in 4 weeks.  On nitro for ankle and can consider the elbow as well.

## 2015-05-16 NOTE — Progress Notes (Signed)
Pre visit review using our clinic review tool, if applicable. No additional management support is needed unless otherwise documented below in the visit note. 

## 2015-05-16 NOTE — Assessment & Plan Note (Signed)
Benadryl 50 mg, Sudafed 60 mg, Prednisone 40 mg prn Epi-pen 

## 2015-05-16 NOTE — Assessment & Plan Note (Signed)
Doing well 

## 2015-05-16 NOTE — Patient Instructions (Signed)
Good to see you  Ice 20 minutes 2 times daily. Usually after activity and before bed. Heel lift in the left shoe Continue the vitamin D  pennsaid pinkie amount topically 2 times daily as needed.  Just don't bath your whole body  Body [email or web address] ankle compression size medium (x-linked) Biking is better than walking and consider elliptical  For the wrist wear brace day and night for 1 week then nightly for 2 weeks Avoid lifting anything overhand.  Only with palms up for now.  Nitroglycerin Protocol   Apply 1/4 nitroglycerin patch to affected area daily.  Change position of patch within the affected area every 24 hours.  You may experience a headache during the first 1-2 weeks of using the patch, these should subside.  If you experience headaches after beginning nitroglycerin patch treatment, you may take your preferred over the counter pain reliever.  Another side effect of the nitroglycerin patch is skin irritation or rash related to patch adhesive.  Please notify our office if you develop more severe headaches or rash, and stop the patch.  Tendon healing with nitroglycerin patch may require 12 to 24 weeks depending on the extent of injury.  Men should not use if taking Viagra, Cialis, or Levitra.   Do not use if you have migraines or rosacea.   See you again in about a month

## 2015-05-16 NOTE — Assessment & Plan Note (Signed)
We discussed age appropriate health related issues, including available/recomended screening tests and vaccinations. We discussed a need for adhering to healthy diet and exercise. Labs/EKG were reviewed/ordered. All questions were answered.   

## 2015-05-16 NOTE — Progress Notes (Signed)
[name] [address] [place], [place] [address] Phone: [phone] Subjective:    I'm seeing this patient by the request  of:  Walker Kehr, MD   CC: Left ankle pain follow-up and new knee pain and right wrist and forearm pain .   [id number] Laurie Hawkins is a 45 y.o. female coming in with complaint of left ankle pain and swelling. Patient was last seen 10 months ago and was given an injection in the posterior tibialis tendon. Patient statespain with significant better after the injection but is starting have increasing discomfort again. Patient has been walking a lot more often to try to help lose weight and help with stress. Patient states when she does this seems to aggravate it more. States that there is increasing swelling again. Wondering what can be done. Does not want to have to keep having injections if possible. Patient is also complaining of some knee pain.on the left side as well. Seems to be worse when she is walking long distances. Patient states that she has had a history of subluxation of the kneecap when she was a child. Patient states that it has not locked or given out on her but does have a severe pain that can sometimes give her a throbbing sensation even at night. Worse when sitting or standing for long amount of time. Rates the severity is 6 out of 10.  Patient is also complaining of right wrist and forearm pain. Seems to be going up towards her elbow on the lateral aspect. Sometimes some weakness when she is tender to something up. Rates the severity is 7 out of 10. Does not remember any injury. Has not tried any modalities at this time.    Past Medical History  Diagnosis Date  . Hypertension     Past Surgical History  Procedure Laterality Date  . Cesarean section  2009  . Thyroidectomy  2011   Social History  Substance Use Topics  . Smoking status: Never Smoker   . Smokeless tobacco: None  . Alcohol Use: 0.0 oz/week   0 Standard drinks or equivalent per week   Allergies  Allergen Reactions  . Bee Venom Anaphylaxis   Family History  Problem Relation Age of Onset  . Arthritis Other   . Alcohol abuse Other   . Cancer Other     Breast and Prostate  . Kidney disease Other   . Mental illness Other   . Diabetes Other   . Sarcoidosis Other   . Rheum arthritis Other     Past medical history, social, surgical and family history all reviewed in electronic medical record.   Review of Systems: No headache, visual changes, nausea, vomiting, diarrhea, constipation, dizziness, abdominal pain, skin rash, fevers, chills, night sweats, weight loss, swollen lymph nodes, body aches, joint swelling, muscle aches, chest pain, shortness of breath, mood changes.   Objective Blood pressure 138/84, pulse 78, height 5\' 9"  (1.753 m), weight 229 lb (103.874 kg), SpO2 97 %.  General: No apparent distress alert and oriented x3 mood and affect normal, dressed appropriately.  HEENT: Pupils equal, extraocular movements intact  Respiratory: Patient's speak in full sentences and does not appear short of breath  Cardiovascular: No lower extremity edema, non tender, no erythema  Skin: Warm dry intact with no signs of infection or rash on extremities or on axial skeleton.  Abdomen: Soft nontender  Neuro: Cranial nerves II through XII are intact, neurovascularly intact in all extremities with 2+  DTRs and 2+ pulses.  Lymph: No lymphadenopathy of posterior or anterior cervical chain or axillae bilaterally.  Gait normal with good balance and coordination.  MSK:  Non tender with full range of motion and good stability and symmetric strength and tone of shoulders,  wrist, hip, bilaterally.   Ankle: Left Minimal swelling notedagain but seems to be near patient's baseline Range of motion is full in all directions. Strength is 4/5 in all directions. Full strength contralateral side Stable lateral and medial ligaments; squeeze test and  kleiger test unremarkable; Talar dome minimally tender today No pain at base of 5th MT; No tenderness over cuboid; No tenderness over N spot or navicular prominence Tender over the medial area over the posterior tibialis tendon No sign of peroneal tendon subluxations or tenderness to palpation Negative tarsal tunnel tinel's Able to walk 4 steps. Contralateral ankle unremarkable  Knee:left Normal to inspection with no erythema or effusion or obvious bony abnormalities. Patient is having tender to palpation mostly over this lateral aspect of the patella. ROM full in flexion and extension and lower leg rotation. Ligaments with solid consistent endpoints including ACL, PCL, LCL, MCL. Negative Mcmurray's, Apley's, and Thessalonian tests. Mild painful patellar compression. Patellar glide mildcrepitus. Patellar and quadriceps tendons unremarkable. Hamstring and quadriceps strength is normal.  Contralateral knee ushows lateral tracking with some mild grind test as well.  Elbow:right Unremarkable to inspection. Range of motion full pronation, supination, flexion, extension. Strength is full to all of the above directions Stable to varus, valgus stress. Negative moving valgus stress test. Tender over the lateral condylar region and pain with resisted wrist extension Ulnar nerve does not sublux. Negative cubital tunnel Tinel's. Contralateral elbow unremarkable    Impression and Recommendations:     This case required medical decision making of moderate complexity.

## 2015-05-16 NOTE — Assessment & Plan Note (Signed)
Discussed proper shoes, heel lifts, icing protocol and given prescription for topical anti-inflammatory's. Worsening symptoms when patient comes back possible injection is needed. Wearing orthotics already. Discussed bracing.

## 2015-07-17 ENCOUNTER — Ambulatory Visit (INDEPENDENT_AMBULATORY_CARE_PROVIDER_SITE_OTHER): Payer: Federal, State, Local not specified - PPO | Admitting: Family Medicine

## 2015-07-17 ENCOUNTER — Other Ambulatory Visit (INDEPENDENT_AMBULATORY_CARE_PROVIDER_SITE_OTHER): Payer: Federal, State, Local not specified - PPO

## 2015-07-17 ENCOUNTER — Encounter: Payer: Self-pay | Admitting: Family Medicine

## 2015-07-17 VITALS — BP 136/82 | HR 82

## 2015-07-17 DIAGNOSIS — M76822 Posterior tibial tendinitis, left leg: Secondary | ICD-10-CM | POA: Diagnosis not present

## 2015-07-17 DIAGNOSIS — M7711 Lateral epicondylitis, right elbow: Secondary | ICD-10-CM

## 2015-07-17 DIAGNOSIS — M2550 Pain in unspecified joint: Secondary | ICD-10-CM

## 2015-07-17 DIAGNOSIS — Z0000 Encounter for general adult medical examination without abnormal findings: Secondary | ICD-10-CM

## 2015-07-17 LAB — HEPATIC FUNCTION PANEL
ALBUMIN: 4.2 g/dL (ref 3.5–5.2)
ALT: 26 U/L (ref 0–35)
AST: 24 U/L (ref 0–37)
Alkaline Phosphatase: 41 U/L (ref 39–117)
Bilirubin, Direct: 0.1 mg/dL (ref 0.0–0.3)
TOTAL PROTEIN: 7.3 g/dL (ref 6.0–8.3)
Total Bilirubin: 0.4 mg/dL (ref 0.2–1.2)

## 2015-07-17 LAB — URINALYSIS, ROUTINE W REFLEX MICROSCOPIC
BILIRUBIN URINE: NEGATIVE
KETONES UR: NEGATIVE
LEUKOCYTES UA: NEGATIVE
Nitrite: NEGATIVE
Specific Gravity, Urine: >=1.03 — AB (ref 1.000–1.030)
Total Protein, Urine: NEGATIVE
URINE GLUCOSE: NEGATIVE
UROBILINOGEN UA: 0.2 (ref 0.0–1.0)
pH: 5.5 (ref 5.0–8.0)

## 2015-07-17 LAB — CBC WITH DIFFERENTIAL/PLATELET
Basophils Absolute: 0 10*3/uL (ref 0.0–0.1)
Basophils Relative: 0.5 % (ref 0.0–3.0)
EOS PCT: 2.4 % (ref 0.0–5.0)
Eosinophils Absolute: 0.2 10*3/uL (ref 0.0–0.7)
HCT: 40.2 % (ref 36.0–46.0)
Hemoglobin: 13.5 g/dL (ref 12.0–15.0)
Lymphocytes Relative: 32.7 % (ref 12.0–46.0)
Lymphs Abs: 2.5 10*3/uL (ref 0.7–4.0)
MCHC: 33.5 g/dL (ref 30.0–36.0)
MCV: 87.2 fl (ref 78.0–100.0)
MONO ABS: 0.6 10*3/uL (ref 0.1–1.0)
MONOS PCT: 7.4 % (ref 3.0–12.0)
NEUTROS ABS: 4.4 10*3/uL (ref 1.4–7.7)
NEUTROS PCT: 57 % (ref 43.0–77.0)
PLATELETS: 248 10*3/uL (ref 150.0–400.0)
RBC: 4.61 Mil/uL (ref 3.87–5.11)
RDW: 14.2 % (ref 11.5–15.5)
WBC: 7.8 10*3/uL (ref 4.0–10.5)

## 2015-07-17 LAB — LIPID PANEL
Cholesterol: 162 mg/dL (ref 0–200)
HDL: 52.5 mg/dL (ref >39.00)
LDL Cholesterol: 97 mg/dL (ref 0–99)
NONHDL: 109.99
Total CHOL/HDL Ratio: 3
Triglycerides: 64 mg/dL (ref 0.0–149.0)
VLDL: 12.8 mg/dL (ref 0.0–40.0)

## 2015-07-17 LAB — BASIC METABOLIC PANEL
BUN: 16 mg/dL (ref 6–23)
CO2: 25 meq/L (ref 19–32)
CREATININE: 1.23 mg/dL — AB (ref 0.40–1.20)
Calcium: 9.3 mg/dL (ref 8.4–10.5)
Chloride: 108 mEq/L (ref 96–112)
GFR: 60.57 mL/min (ref >60.00)
Glucose, Bld: 88 mg/dL (ref 70–99)
Potassium: 4 mEq/L (ref 3.5–5.1)
SODIUM: 139 meq/L (ref 135–145)

## 2015-07-17 LAB — TSH: TSH: 1.48 u[IU]/mL (ref 0.35–4.50)

## 2015-07-17 MED ORDER — PREDNISONE 50 MG PO TABS: 50.0000 mg | ORAL_TABLET | Freq: Every day | ORAL | Status: DC

## 2015-07-17 NOTE — Patient Instructions (Addendum)
Good to see you  Prednisone daily for 5 days to help with all aches and pain.  We can consider testing you for Rheumatoid if needed.  Heel donut would be great to help both the tendonitis and the heel pain .  Continue the nitro. See me again and consider the PRP before your next [place] trip

## 2015-07-17 NOTE — Assessment & Plan Note (Signed)
The patient having multiple aches and pains concern for underlying autoimmune disease with family history. We discussed the possible laboratory workup could be beneficial. Patient was given prednisone to see if she responds. Follow-up again with me in 3-4 weeks to see how she does and we'll consider laboratory workup at that time.

## 2015-07-17 NOTE — Assessment & Plan Note (Signed)
Resolving slowly at this time. We'll continue to monitor. Forcing symptoms can respond fairly well to injection. Patient could also be a candidate for formal physical therapy.

## 2015-07-17 NOTE — Assessment & Plan Note (Signed)
Discussed with patient at great length. We discussed that we can repeat injection if necessary which patient declined at the moment. We will consider that in the future. Patient having a family history of rheumatoid arthritis this could be concerning we did discuss possible laboratory workup which patient declined.we also discussed the possibility of PRP injections and patient would like to do that she will call ahead of time. We discussed vitamin D supplementation and the importance of this. Given 5 day burst of prednisone secondary to all the different polyarthralgias that she is having at the moment. We will see if she responds. Patient and will come back and see me again before she leaves for [place] the next 3 weeks.

## 2015-07-17 NOTE — Progress Notes (Signed)
[name] [address], [place] [address] Phone: [phone] Subjective:    I'm seeing this patient by the request  of:  Walker Kehr, MD   CC: Left ankle pain follow-up a right wrist and forearm pain .f/u   [id number] Laurie Hawkins is a 45 y.o. female coming in with complaint of left ankle pain and swelling. Patient responded fairly well to a posterior tibialis injection greater than year ago. Patient did have to move back to [place]. While patient is there she did see another provider and MRI was ordered. This was independently visualized by me. Patient didn't have signs of posterior tibialis tendinitis with intersubstance tearing as well as some mild peroneal tendinitis. Some mild spurring of the calcaneal area. Otherwise fairly unremarkable. Patient has been putting custom orthotics and has been doing the exercises occasionally. States that she has made improvement but has not completely gone. Still has some mild feelings of instability of the ankle. Does not want to wear the brace or time. Patient is also not icing regularly.  Patient is also complaining of right wrist and forearm pain. Town having more of a lateral epicondylitis. States that she has made improvement. States that things like typing for long amount of time seems to irritate it. Nothing as severe as what it was previously. Just feels that all her joints sometimes give her some trouble. Family history of rheumatoid arthritis in her mother.    Past Medical History  Diagnosis Date  . Hypertension     Past Surgical History  Procedure Laterality Date  . Cesarean section  2009  . Thyroidectomy  2011   Social History  Substance Use Topics  . Smoking status: Never Smoker   . Smokeless tobacco: None  . Alcohol Use: 0.0 oz/week    0 Standard drinks or equivalent per week   Allergies  Allergen Reactions  . Bee Venom Anaphylaxis   Family History  Problem Relation Age of  Onset  . Arthritis Other   . Alcohol abuse Other   . Cancer Other     Breast and Prostate  . Kidney disease Other   . Mental illness Other   . Diabetes Other   . Sarcoidosis Other   . Rheum arthritis Other     Past medical history, social, surgical and family history all reviewed in electronic medical record.   Review of Systems: No headache, visual changes, nausea, vomiting, diarrhea, constipation, dizziness, abdominal pain, skin rash, fevers, chills, night sweats, weight loss, swollen lymph nodes, body aches, joint swelling, muscle aches, chest pain, shortness of breath, mood changes.   Objective Blood pressure 136/82, pulse 82.  General: No apparent distress alert and oriented x3 mood and affect normal, dressed appropriately.  HEENT: Pupils equal, extraocular movements intact  Respiratory: Patient's speak in full sentences and does not appear short of breath  Cardiovascular: No lower extremity edema, non tender, no erythema  Skin: Warm dry intact with no signs of infection or rash on extremities or on axial skeleton.  Abdomen: Soft nontender  Neuro: Cranial nerves II through XII are intact, neurovascularly intact in all extremities with 2+ DTRs and 2+ pulses.  Lymph: No lymphadenopathy of posterior or anterior cervical chain or axillae bilaterally.  Gait normal with good balance and coordination.  MSK:  Non tender with full range of motion and good stability and symmetric strength and tone of shoulders,  wrist, hip, bilaterally.   Ankle: Left No swelling noted today. Range of  motion is full in all directions. Strength is 4/5 in all directions. Full strength contralateral side Stable lateral and medial ligaments; squeeze test and kleiger test unremarkable; Talar dome nontender No pain at base of 5th MT; No tenderness over cuboid; No tenderness over N spot or navicular prominence Tender over the medial area over the posterior tibialis tendon still present and fairly severe Mild  discomfort over the peroneal tendons. Negative tarsal tunnel tinel's Able to walk 4 steps. Contralateral ankle unremarkable  Elbow:right Unremarkable to inspection. Range of motion full pronation, supination, flexion, extension. Strength is full to all of the above directions Stable to varus, valgus stress. Negative moving valgus stress test. Minimal tenderness over the lateral epicondylar region. Significant improvement. Ulnar nerve does not sublux. Negative cubital tunnel Tinel's. Contralateral elbow unremarkable    Impression and Recommendations:     This case required medical decision making of moderate complexity.

## 2015-07-28 ENCOUNTER — Other Ambulatory Visit: Payer: Self-pay | Admitting: Internal Medicine

## 2015-07-28 DIAGNOSIS — R7989 Other specified abnormal findings of blood chemistry: Secondary | ICD-10-CM

## 2015-07-31 ENCOUNTER — Ambulatory Visit
Admission: RE | Admit: 2015-07-31 | Discharge: 2015-07-31 | Disposition: A | Discharge status: Home / Self Care | Payer: Federal, State, Local not specified - PPO | Source: Ambulatory Visit | Attending: Internal Medicine | Admitting: Internal Medicine

## 2015-07-31 ENCOUNTER — Ambulatory Visit (INDEPENDENT_AMBULATORY_CARE_PROVIDER_SITE_OTHER): Payer: Federal, State, Local not specified - PPO | Admitting: Family Medicine

## 2015-07-31 ENCOUNTER — Encounter: Payer: Self-pay | Admitting: Family Medicine

## 2015-07-31 ENCOUNTER — Other Ambulatory Visit: Payer: Federal, State, Local not specified - PPO

## 2015-07-31 ENCOUNTER — Other Ambulatory Visit (INDEPENDENT_AMBULATORY_CARE_PROVIDER_SITE_OTHER): Payer: Federal, State, Local not specified - PPO

## 2015-07-31 VITALS — BP 134/82 | HR 78 | Wt 242.0 lb

## 2015-07-31 DIAGNOSIS — M353 Polymyalgia rheumatica: Secondary | ICD-10-CM | POA: Diagnosis not present

## 2015-07-31 DIAGNOSIS — R7989 Other specified abnormal findings of blood chemistry: Secondary | ICD-10-CM

## 2015-07-31 DIAGNOSIS — M76822 Posterior tibial tendinitis, left leg: Secondary | ICD-10-CM | POA: Diagnosis not present

## 2015-07-31 LAB — SEDIMENTATION RATE: Sed Rate: 8 mm/hr (ref 0–20)

## 2015-07-31 LAB — C-REACTIVE PROTEIN: CRP: 0.1 mg/dL — ABNORMAL LOW (ref 0.5–20.0)

## 2015-07-31 MED ORDER — PREDNISONE 50 MG PO TABS: 50.0000 mg | ORAL_TABLET | Freq: Every day | ORAL | Status: DC

## 2015-07-31 NOTE — Assessment & Plan Note (Addendum)
We'll continue conservative therapy at this time. Patient is had significant number of different muscle skeletal complaints over the years. I do believe the patient should be tested for autoimmune diseases. She will have this done. Depending on findings this could change medical management. Patient will continue all other conservative therapy and given topical anti-implant towards, icing, home exercises and will return to see me again if worsening symptoms.  Spent  25 minutes with patient face-to-face and had greater than 50% of counseling including as described above in assessment and plan.

## 2015-07-31 NOTE — Patient Instructions (Signed)
Good to see you  We will get labs to make sure we are not missing anything Gave you more prednisone Be sparingly with the pennsaid Keep trucking along.  Travel safe.  See me when you need me or write me.

## 2015-07-31 NOTE — Progress Notes (Signed)
[name] [place], [place] [address] Phone: [phone] Subjective:    I'm seeing this patient by the request  of:  Walker Kehr, MD   CC: Left ankle pain follow-up   [id number] Laurie Hawkins is a 45 y.o. female coming in with complaint of left ankle pain and swelling. Patient responded fairly well to a posterior tibialis injection greater than year ago. Patient did have to move back to [place]. While patient is there she did see another provider and MRI was ordered. This was independently visualized by me. Patient didn't have signs of posterior tibialis tendinitis with intersubstance tearing as well as some mild peroneal tendinitis. Some mild spurring of the calcaneal area. Otherwise fairly unremarkable. Patient has been putting custom orthotics and has been doing the exercises occasionally. Patient is less visit though was given prednisone. States that she was complete pain-free. Slowly coming back again. Patient does the exercises occasionally but states that it is difficult in her busy life schedule.  Patient did respond very well to the prednisone and a lot of her other aches and pains with better while she was on it. States some of them are slowly starting to come back. States that the wrist and elbow pain was completed, and now starting to slowly come back. Able to do daily activities finding did even work out. Does have a strong family history of rheumatoid arthritis and likely would like laboratory workup.    Past Medical History  Diagnosis Date  . Hypertension     Past Surgical History  Procedure Laterality Date  . Cesarean section  2009  . Thyroidectomy  2011   Social History  Substance Use Topics  . Smoking status: Never Smoker   . Smokeless tobacco: None  . Alcohol Use: 0.0 oz/week    0 Standard drinks or equivalent per week   Allergies  Allergen Reactions  . Bee Venom Anaphylaxis   Family History  Problem Relation  Age of Onset  . Arthritis Other   . Alcohol abuse Other   . Cancer Other     Breast and Prostate  . Kidney disease Other   . Mental illness Other   . Diabetes Other   . Sarcoidosis Other   . Rheum arthritis Other     Past medical history, social, surgical and family history all reviewed in electronic medical record.   Review of Systems: No headache, visual changes, nausea, vomiting, diarrhea, constipation, dizziness, abdominal pain, skin rash, fevers, chills, night sweats, weight loss, swollen lymph nodes, body aches, joint swelling, muscle aches, chest pain, shortness of breath, mood changes.   Objective Blood pressure 134/82, pulse 78, weight 242 lb (109.77 kg).  General: No apparent distress alert and oriented x3 mood and affect normal, dressed appropriately.  HEENT: Pupils equal, extraocular movements intact  Respiratory: Patient's speak in full sentences and does not appear short of breath  Cardiovascular: No lower extremity edema, non tender, no erythema  Skin: Warm dry intact with no signs of infection or rash on extremities or on axial skeleton.  Abdomen: Soft nontender  Neuro: Cranial nerves II through XII are intact, neurovascularly intact in all extremities with 2+ DTRs and 2+ pulses.  Lymph: No lymphadenopathy of posterior or anterior cervical chain or axillae bilaterally.  Gait normal with good balance and coordination.  MSK:  Non tender with full range of motion and good stability and symmetric strength and tone of shoulders,  wrist, hip, bilaterally.   Ankle:  Left No swelling noted today. Range of motion is full in all directions. Strength is 4/5 in all directions. Full strength contralateral side Stable lateral and medial ligaments; squeeze test and kleiger test unremarkable; Talar dome nontender No pain at base of 5th MT; No tenderness over cuboid; No tenderness over N spot or navicular prominence Tender over the medial area over the posterior tibialis tendon  still present it seems stable from previous exam Negative tarsal tunnel tinel's Able to walk 4 steps. Contralateral ankle unremarkable     Impression and Recommendations:     This case required medical decision making of moderate complexity.

## 2015-08-01 LAB — RHEUMATOID FACTOR

## 2015-08-01 LAB — SJOGREN'S SYNDROME ANTIBODS(SSA + SSB)
SSA (RO) (ENA) ANTIBODY, IGG: NEGATIVE
SSB (LA) (ENA) ANTIBODY, IGG: NEGATIVE

## 2015-08-01 LAB — ANTI-DNA ANTIBODY, DOUBLE-STRANDED

## 2015-08-01 LAB — CYCLIC CITRUL PEPTIDE ANTIBODY, IGG: Cyclic Citrullin Peptide Ab: <16 Units

## 2015-08-01 LAB — ANA: Anti Nuclear Antibody(ANA): NEGATIVE

## 2015-08-05 LAB — ANGIOTENSIN CONVERTING ENZYME: Angiotensin-Converting Enzyme: 32 U/L (ref 8–52)

## 2015-12-31 ENCOUNTER — Encounter: Payer: Self-pay | Admitting: Internal Medicine

## 2016-01-07 ENCOUNTER — Encounter: Payer: Self-pay | Admitting: Internal Medicine

## 2016-01-08 NOTE — Telephone Encounter (Signed)
Duplicate email see previous one. MD ok letter to be written...Laurie Hawkins

## 2016-01-15 NOTE — Telephone Encounter (Signed)
Please release letter to mychart ASAP. 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 ty

## 2016-01-19 NOTE — Telephone Encounter (Signed)
Printed letter, MD signed mail to patient...Laurie Hawkins

## 2016-01-26 NOTE — Progress Notes (Signed)
[name] [place], [place] [address] Phone: [phone] Subjective:    I'm seeing this patient by the request  of:  Walker Kehr, MD   CC: Left ankle pain follow-up   [id number]  Laurie Hawkins is a 46 y.o. female coming in with complaint of left ankle pain and swelling. Patient responded fairly well to a posterior tibialis injection greater than year ago. Patient did have to move back to [place]. While patient is there she did see another provider and MRI was ordered. This was independently visualized by me. Patient didn't have signs of posterior tibialis tendinitis with intersubstance tearing as well as some mild peroneal tendinitis. Some mild spurring of the calcaneal area. Otherwise fairly unremarkable. Patient was doing significant better while she was in her custom orthotics. Recently over the course last several months that having increasing pain again. Seems to be more on the lateral aspect the left foot. Patient states that it is more of a soreness. Seems worse when she is barefoot. Little bit different than her previous pain. Patient though states that the ankle pain bilaterally is starting to occur again.  In addition of this patient is also having right knee pain. States that she does have a past medical history significant for a patellar subluxation. Patient feels that most the pain seems to be on the anterior aspect of the knee. Sometimes feels like it is going to give out on her. Rates the severity pain as 5 out of 10. Has not done any different activities. Patient would like to be more active but finds it more difficult because it does cause more aches and pains overall.  Patient did respond very well to the prednisone and a lot of her other aches and pains with better while she was on it. Patient does have limited for a workup before for autoimmune diseases that was completely unremarkable. Has had decreased urine previously and was  supposed to be taking iron supplementations. Patient does take the iron but has not notice significant benefit.    Past Medical History:  Diagnosis Date  . Hypertension     Past Surgical History:  Procedure Laterality Date  . CESAREAN SECTION  2009  . THYROIDECTOMY  2011   Social History  Substance Use Topics  . Smoking status: Never Smoker  . Smokeless tobacco: Not on file  . Alcohol use 0.0 oz/week   Allergies  Allergen Reactions  . Bee Venom Anaphylaxis   Family History  Problem Relation Age of Onset  . Arthritis Other   . Alcohol abuse Other   . Cancer Other     Breast and Prostate  . Kidney disease Other   . Mental illness Other   . Diabetes Other   . Sarcoidosis Other   . Rheum arthritis Other     Past medical history, social, surgical and family history all reviewed in electronic medical record.   Review of Systems: No headache, visual changes, nausea, vomiting, diarrhea, constipation, dizziness, abdominal pain, skin rash, fevers, chills, night sweats, weight loss, swollen lymph nodes,chest pain, shortness of breath, mood changes. Significant for joint swellings, hand and feet cramping, as well as muscle aches and pains.  Objective  Blood pressure 135/82, pulse 80, height 5\' 9"  (1.753 m), weight 233 lb 6.4 oz (105.9 kg).  Systems examined below as of 01/27/16 General: NAD A&O x3 mood, affect normal  HEENT: Pupils equal, extraocular movements intact no nystagmus Respiratory: not short of breath at  rest or with speaking Cardiovascular: No lower extremity edema, non tender Skin: Warm dry intact with no signs of infection or rash on extremities or on axial skeleton. Abdomen: Soft nontender, no masses Neuro: Cranial nerves  intact, neurovascularly intact in all extremities with 2+ DTRs and 2+ pulses. Lymph: No lymphadenopathy appreciated today  Gait normal with good balance and coordination.  MSK: Non tender with full range of motion and good stability and  symmetric strength and tone of shoulders, elbows, wrist,  hips bilaterally.   Mild aches and pains multiple muscles that seem out of proportion. Ankle: Left No swelling noted today. Range of motion is full in all directions. Strength is 4/5 in all directions. Full strength contralateral side Stable lateral and medial ligaments; squeeze test and kleiger test unremarkable; Talar dome nontender No pain at base of 5th MT; No tenderness over cuboid; No tenderness over N spot or navicular prominence Seems nontender overall. Negative tarsal tunnel tinel's Able to walk 4 steps. Contralateral ankle unremarkable The patient does have tenderness over the fourth metatarsal. Mild swelling noted proximally. No crepitus noted. Tender to palpation.  Knee: Right Chronic lateral subluxation of patient's kneecap Tender over the patellofemoral joint ROM full in flexion and extension and lower leg rotation. Ligaments with solid consistent endpoints including ACL, PCL, LCL, MCL. Negative Mcmurray's, Apley's, and Thessalonian tests. painful patellar compression. Patellar glide with moderate crepitus. Patellar and quadriceps tendons unremarkable. Hamstring and quadriceps strength is normal.  Contralateral knee has mild crepitus is well but nontender.  Procedure note [id number]; 15 minutes spent for Therapeutic exercises as stated in above notes.  This included exercises focusing on stretching, strengthening, with significant focus on eccentric aspects.   Vastus medialis oblique exercises given as well as hip abductor strengthening exercises given. Hamstring stretching also included Proper technique shown and discussed handout in great detail with ATC.  All questions were discussed and answered.      Impression and Recommendations:     This case required medical decision making of moderate complexity.

## 2016-01-27 ENCOUNTER — Ambulatory Visit (INDEPENDENT_AMBULATORY_CARE_PROVIDER_SITE_OTHER): Payer: Federal, State, Local not specified - PPO | Admitting: Family Medicine

## 2016-01-27 ENCOUNTER — Encounter: Payer: Self-pay | Admitting: Family Medicine

## 2016-01-27 DIAGNOSIS — M222X1 Patellofemoral disorders, right knee: Secondary | ICD-10-CM

## 2016-01-27 DIAGNOSIS — M76822 Posterior tibial tendinitis, left leg: Secondary | ICD-10-CM | POA: Diagnosis not present

## 2016-01-27 DIAGNOSIS — M222X2 Patellofemoral disorders, left knee: Secondary | ICD-10-CM

## 2016-01-27 DIAGNOSIS — M65972 Unspecified synovitis and tenosynovitis, left ankle and foot: Secondary | ICD-10-CM | POA: Insufficient documentation

## 2016-01-27 DIAGNOSIS — M659 Synovitis and tenosynovitis, unspecified: Secondary | ICD-10-CM | POA: Diagnosis not present

## 2016-01-27 MED ORDER — PREDNISONE 50 MG PO TABS: 50.0000 mg | ORAL_TABLET | Freq: Every day | ORAL | 0 refills | Status: DC

## 2016-01-27 NOTE — Patient Instructions (Addendum)
Good to see you  Alvera Singh is your friend.  pennsaid pinkie amount topically 2 times daily as needed.  We may some changes to your orthotics and should help Hoka Bondi 5 could be a good shoe for you as well.  Prednisone daily for 5 days  Exercises 3 times a week.  Check iron labs and send me a message.  Wear the brace with activity  See me again when you get back and send me updates every couple of months.

## 2016-01-27 NOTE — Assessment & Plan Note (Signed)
Home exercises given today. Name patellar stabilization brace was also given. We discussed icing regimen and given trial of topical anti-inflammatories given. Encourage patient to continue stay active. [place]

## 2016-01-27 NOTE — Assessment & Plan Note (Signed)
Made adjustments to her custom orthotics. Discussed over-the-counter shoes that could be beneficial. Discussed encourage patient to do the exercises. [place] again [place] country. Worsening symptoms we'll consider injection.

## 2016-01-27 NOTE — Assessment & Plan Note (Signed)
Patient will try harder shoes, change patient's orthotics. Worsening symptoms consider injection.

## 2016-01-28 ENCOUNTER — Encounter: Payer: Self-pay | Admitting: Family Medicine

## 2016-01-28 ENCOUNTER — Encounter: Payer: Self-pay | Admitting: Internal Medicine

## 2016-01-28 DIAGNOSIS — D509 Iron deficiency anemia, unspecified: Secondary | ICD-10-CM

## 2016-01-30 ENCOUNTER — Other Ambulatory Visit (INDEPENDENT_AMBULATORY_CARE_PROVIDER_SITE_OTHER): Payer: Federal, State, Local not specified - PPO

## 2016-01-30 DIAGNOSIS — D509 Iron deficiency anemia, unspecified: Secondary | ICD-10-CM | POA: Diagnosis not present

## 2016-01-30 LAB — IBC PANEL
IRON: 43 ug/dL (ref 42–145)
Saturation Ratios: 12 % — ABNORMAL LOW (ref 20.0–50.0)
TRANSFERRIN: 255 mg/dL (ref 212.0–360.0)

## 2016-05-26 ENCOUNTER — Encounter: Payer: Self-pay | Admitting: Gynecology

## 2016-06-25 ENCOUNTER — Telehealth: Payer: Self-pay | Admitting: Internal Medicine

## 2016-07-13 ENCOUNTER — Encounter: Payer: Self-pay | Admitting: Internal Medicine

## 2016-07-22 ENCOUNTER — Telehealth: Payer: Self-pay | Admitting: Internal Medicine

## 2016-07-22 DIAGNOSIS — Z0000 Encounter for general adult medical examination without abnormal findings: Secondary | ICD-10-CM

## 2016-07-22 NOTE — Telephone Encounter (Signed)
Patient has CPE on the 20th.  Is requesting labs to be entered early to be completed before CPE.  Please follow up once entered.

## 2016-07-22 NOTE — Telephone Encounter (Signed)
Please advise about what labs to enter 

## 2016-07-23 NOTE — Telephone Encounter (Signed)
Labs entered, LM with mother to notify pt because I could not reach pt on cell phone

## 2016-07-23 NOTE — Telephone Encounter (Signed)
OK  CBC BMET LFT Lipids TSH UA Thx

## 2016-07-27 ENCOUNTER — Ambulatory Visit: Payer: Self-pay | Admitting: Family Medicine

## 2016-07-27 NOTE — Progress Notes (Deleted)
[name] [place], [place] [address] Phone: [phone] Subjective:    I'm seeing this patient by the request  of:  Plotnikov, Evie Lacks, MD   CC: Left ankle pain follow-up knee pain follow up   [id number]  Laurie Hawkins is a 46 y.o. female coming in with complaint of left ankle pain and swelling. Patient responded fairly well to a posterior tibialis injection greater than year ago. Patient did have to move back to [place]. While patient is there she did see another provider and MRI was ordered. This was independently visualized by me. Patient did have signs of posterior tibialis tendinitis with intersubstance tearing as well as some mild peroneal tendinitis. Some mild spurring of the calcaneal area.  Patient is in custom orthotics and has responded l to injections previously. Patient states  Patient was found to have more  patellofemoral arthralgia of the knees. Patient was to start home exercises. Patient states  Patient was also having polyarthralgia. Laboratory workup was done showing the patient did have mild iron deficiency but otherwise unremarkable.    Past Medical History:  Diagnosis Date  . Hypertension     Past Surgical History:  Procedure Laterality Date  . CESAREAN SECTION  2009  . THYROIDECTOMY  2011   Social History  Substance Use Topics  . Smoking status: Never Smoker  . Smokeless tobacco: Not on file  . Alcohol use 0.0 oz/week   Allergies  Allergen Reactions  . Bee Venom Anaphylaxis   Family History  Problem Relation Age of Onset  . Arthritis Other   . Alcohol abuse Other   . Cancer Other        Breast and Prostate  . Kidney disease Other   . Mental illness Other   . Diabetes Other   . Sarcoidosis Other   . Rheum arthritis Other     Past medical history, social, surgical and family history all reviewed in electronic medical record.   Review of Systems: No headache, visual changes, nausea, vomiting,  diarrhea, constipation, dizziness, abdominal pain, skin rash, fevers, chills, night sweats, weight loss, swollen lymph nodes,chest pain, shortness of breath, mood changes. Significant for joint swellings, hand and feet cramping, as well as muscle aches and pains.  Objective  There were no vitals taken for this visit.  Systems examined below as of 07/27/16 General: NAD A&O x3 mood, affect normal  HEENT: Pupils equal, extraocular movements intact no nystagmus Respiratory: not short of breath at rest or with speaking Cardiovascular: No lower extremity edema, non tender Skin: Warm dry intact with no signs of infection or rash on extremities or on axial skeleton. Abdomen: Soft nontender, no masses Neuro: Cranial nerves  intact, neurovascularly intact in all extremities with 2+ DTRs and 2+ pulses. Lymph: No lymphadenopathy appreciated today  Gait normal with good balance and coordination.  MSK: Non tender with full range of motion and good stability and symmetric strength and tone of shoulders, elbows, wrist,  hips bilaterally.   Mild aches and pains multiple muscles that seem out of proportion. Ankle: Left No swelling noted today. Range of motion is full in all directions. Strength is 4/5 in all directions. Full strength contralateral side Stable lateral and medial ligaments; squeeze test and kleiger test unremarkable; Talar dome nontender No pain at base of 5th MT; No tenderness over cuboid; No tenderness over N spot or navicular prominence Seems nontender overall. Negative tarsal tunnel tinel's Able to walk 4 steps. Contralateral ankle  unremarkable The patient does have tenderness over the fourth metatarsal. Mild swelling noted proximally. No crepitus noted. Tender to palpation.  Knee: Right Chronic lateral subluxation of patient's kneecap Tender over the patellofemoral joint ROM full in flexion and extension and lower leg rotation. Ligaments with solid consistent endpoints including  ACL, PCL, LCL, MCL. Negative Mcmurray's, Apley's, and Thessalonian tests. painful patellar compression. Patellar glide with moderate crepitus. Patellar and quadriceps tendons unremarkable. Hamstring and quadriceps strength is normal.  Contralateral knee has mild crepitus is well but nontender.       Impression and Recommendations:     This case required medical decision making of moderate complexity.

## 2016-07-29 ENCOUNTER — Ambulatory Visit: Payer: Self-pay

## 2016-07-29 ENCOUNTER — Other Ambulatory Visit (INDEPENDENT_AMBULATORY_CARE_PROVIDER_SITE_OTHER): Payer: Federal, State, Local not specified - PPO

## 2016-07-29 ENCOUNTER — Ambulatory Visit (INDEPENDENT_AMBULATORY_CARE_PROVIDER_SITE_OTHER): Payer: Federal, State, Local not specified - PPO | Admitting: Family Medicine

## 2016-07-29 VITALS — BP 144/94 | HR 78 | Wt 237.0 lb

## 2016-07-29 DIAGNOSIS — M79605 Pain in left leg: Secondary | ICD-10-CM

## 2016-07-29 DIAGNOSIS — M222X1 Patellofemoral disorders, right knee: Secondary | ICD-10-CM

## 2016-07-29 DIAGNOSIS — M222X2 Patellofemoral disorders, left knee: Secondary | ICD-10-CM | POA: Diagnosis not present

## 2016-07-29 DIAGNOSIS — Z0000 Encounter for general adult medical examination without abnormal findings: Secondary | ICD-10-CM | POA: Diagnosis not present

## 2016-07-29 LAB — URINALYSIS
BILIRUBIN URINE: NEGATIVE
HGB URINE DIPSTICK: NEGATIVE
Ketones, ur: NEGATIVE
LEUKOCYTES UA: NEGATIVE
NITRITE: NEGATIVE
Specific Gravity, Urine: 1.025 (ref 1.000–1.030)
Total Protein, Urine: NEGATIVE
UROBILINOGEN UA: 0.2 (ref 0.0–1.0)
Urine Glucose: NEGATIVE
pH: 6 (ref 5.0–8.0)

## 2016-07-29 LAB — BASIC METABOLIC PANEL
BUN: 10 mg/dL (ref 6–23)
CHLORIDE: 103 meq/L (ref 96–112)
CO2: 30 mEq/L (ref 19–32)
CREATININE: 1.16 mg/dL (ref 0.40–1.20)
Calcium: 9.2 mg/dL (ref 8.4–10.5)
GFR: 64.51 mL/min (ref >60.00)
Glucose, Bld: 91 mg/dL (ref 70–99)
POTASSIUM: 3.7 meq/L (ref 3.5–5.1)
SODIUM: 139 meq/L (ref 135–145)

## 2016-07-29 LAB — CBC WITH DIFFERENTIAL/PLATELET
BASOS PCT: 0.7 % (ref 0.0–3.0)
Basophils Absolute: 0 10*3/uL (ref 0.0–0.1)
EOS PCT: 4 % (ref 0.0–5.0)
Eosinophils Absolute: 0.3 10*3/uL (ref 0.0–0.7)
HCT: 41.1 % (ref 36.0–46.0)
HEMOGLOBIN: 13.8 g/dL (ref 12.0–15.0)
LYMPHS ABS: 1.9 10*3/uL (ref 0.7–4.0)
Lymphocytes Relative: 29.3 % (ref 12.0–46.0)
MCHC: 33.5 g/dL (ref 30.0–36.0)
MCV: 88.3 fl (ref 78.0–100.0)
MONOS PCT: 7.1 % (ref 3.0–12.0)
Monocytes Absolute: 0.5 10*3/uL (ref 0.1–1.0)
Neutro Abs: 3.9 10*3/uL (ref 1.4–7.7)
Neutrophils Relative %: 58.9 % (ref 43.0–77.0)
Platelets: 231 10*3/uL (ref 150.0–400.0)
RBC: 4.65 Mil/uL (ref 3.87–5.11)
RDW: 13.8 % (ref 11.5–15.5)
WBC: 6.6 10*3/uL (ref 4.0–10.5)

## 2016-07-29 LAB — TSH: TSH: 2.92 u[IU]/mL (ref 0.35–4.50)

## 2016-07-29 LAB — HEPATIC FUNCTION PANEL
ALBUMIN: 4 g/dL (ref 3.5–5.2)
ALT: 41 U/L — AB (ref 0–35)
AST: 29 U/L (ref 0–37)
Alkaline Phosphatase: 41 U/L (ref 39–117)
Bilirubin, Direct: 0.1 mg/dL (ref 0.0–0.3)
Total Bilirubin: 0.5 mg/dL (ref 0.2–1.2)
Total Protein: 6.7 g/dL (ref 6.0–8.3)

## 2016-07-29 LAB — LIPID PANEL
CHOLESTEROL: 187 mg/dL (ref 0–200)
HDL: 64.9 mg/dL (ref >39.00)
LDL Cholesterol: 96 mg/dL (ref 0–99)
NonHDL: 121.8
Total CHOL/HDL Ratio: 3
Triglycerides: 130 mg/dL (ref 0.0–149.0)
VLDL: 26 mg/dL (ref 0.0–40.0)

## 2016-07-29 MED ORDER — TRAMADOL HCL 50 MG PO TABS: 50.0000 mg | ORAL_TABLET | Freq: Three times a day (TID) | ORAL | 0 refills | Status: DC | PRN

## 2016-07-29 MED ORDER — PREDNISONE 50 MG PO TABS: 50.0000 mg | ORAL_TABLET | Freq: Every day | ORAL | 0 refills | Status: DC

## 2016-07-29 NOTE — Progress Notes (Signed)
[facility] [place], [place] [address] Phone: [phone] Subjective:    I'm seeing this patient by the request  of:  Plotnikov, Evie Lacks, MD   CC: Left ankle pain follow-up knee pain follow up   [id number]  Laurie Hawkins is a 46 y.o. female coming in with complaint of left ankle pain and swelling. Patient responded fairly well to a posterior tibialis injection greater than year ago. Patient did have to move back to [place]. While patient is there she did see another provider and MRI was ordered. This was independently visualized by me. Patient did have signs of posterior tibialis tendinitis with intersubstance tearing as well as some mild peroneal tendinitis. Some mild spurring of the calcaneal area.  Patient is in custom orthotics and has responded l to injections previously. Patient states Ankle has been hurting. Seems to be radiating up towards the calf. Causing more pain at the knee. States that she is having difficulty even with ambulation. States that even sitting has a lot of pain. Has will correct at night. Denies any true injury. Rates the severity of pain though as 9 out of 10. Not responding over-the-counter medications.  Patient was found to have more  patellofemoral arthralgia of the knees. Patient was to start home exercises. Patient states unable to do the exercises and her to worsening symptoms.  Patient was also having polyarthralgia. Laboratory workup was done showing the patient did have mild iron deficiency but otherwise unremarkable. Still having some aches and pains but seems that the leg seems to be worsening.    Past Medical History:  Diagnosis Date  . Hypertension     Past Surgical History:  Procedure Laterality Date  . CESAREAN SECTION  2009  . THYROIDECTOMY  2011   Social History  Substance Use Topics  . Smoking status: Never Smoker  . Smokeless tobacco: Not on file  . Alcohol use 0.0 oz/week   Allergies    Allergen Reactions  . Bee Venom Anaphylaxis   Family History  Problem Relation Age of Onset  . Arthritis Other   . Alcohol abuse Other   . Cancer Other        Breast and Prostate  . Kidney disease Other   . Mental illness Other   . Diabetes Other   . Sarcoidosis Other   . Rheum arthritis Other     Past medical history, social, surgical and family history all reviewed in electronic medical record.   Review of Systems: No headache, visual changes, nausea, vomiting, diarrhea, constipation, dizziness, abdominal pain, skin rash, fevers, chills, night sweats, weight loss, swollen lymph nodes, body aches, joint swelling, chest pain, shortness of breath, mood changes.  Positive muscle aches  Objective  Blood pressure (!) 144/94, pulse 78, weight 237 lb (107.5 kg), SpO2 98 %.  Systems examined below as of 07/29/16 General: NAD A&O x3 mood, affect normal  HEENT: Pupils equal, extraocular movements intact no nystagmus Respiratory: not short of breath at rest or with speaking Cardiovascular: No lower extremity edema, non tender Skin: Warm dry intact with no signs of infection or rash on extremities or on axial skeleton. Abdomen: Soft nontender, no masses Neuro: Cranial nerves  intact, neurovascularly intact in all extremities with 2+ DTRs and 2+ pulses. Lymph: No lymphadenopathy appreciated today  GaitSeverely antalgic gait MSK: Non tender with full range of motion and good stability and symmetric strength and tone of shoulders, elbows, wrist,  hips bilaterally.  Knee: Left Mild lateral tilt noted. Severe tenderness to palpation surrounding the knee as well as the calf. ROM full in flexion and extension and lower leg rotation. Ligaments with solid consistent endpoints including ACL, PCL, LCL, MCL. Negative Mcmurray's, Apley's, and Thessalonian tests. painful patellar compression. Patellar glide with moderate crepitus. Patellar and quadriceps tendons unremarkable. Hamstring and  quadriceps strength is normal.  Severe tenderness to compression of the calf Mild swelling noted in the calf compared to the contralateral side. Worsening pain with dorsiflexion of the foot in the calf  MSK US performed of: Left knee This study was ordered, performed, and interpreted by Charlann Boxer D.O.  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 Patient does not have any hypoechoic changes or any effusion. Patient does have some mild degenerative changes of the meniscus. MCL and LCL intact. Calf does not show any major tear.  IMPRESSION: Mild patellofemoral otherwise unremarkable     Impression and Recommendations:     This case required medical decision making of moderate complexity.

## 2016-07-29 NOTE — Assessment & Plan Note (Signed)
Patient is a history of patellofemoral arthralgia. This could be worsening symptoms. Discussed with patient at great length though because patient significant pain and even to light palpation of the calf at do feel that we need to rule out any type of deep venous thrombosis. Patient was taking a full dose aspirin at this time. If this test is normal then we will treat as more of the patellofemoral. Prednisone given, discussed icing regimen, discussed home exercises. Patient given a brace as well. Patient is being nearly nonambulatory at this time. Patient knows if any chest pain, shortness of breath, severe amount of pain that seems to be worsening to seek medical attention immediately. Patient will be traveling back to [place] in the near future which does complicate follow-up. Patient will follow-up with her other provider there.

## 2016-07-29 NOTE — Patient Instructions (Signed)
Good to see you  We will get ultrasound of the leg to rule out clot.  Ice is your friend Try to wear brace daily  If not  aclot lets start prednsione daily for 5 days.  Tramadol at night if you need it for pain.  Should get better on own if it is from the knee but need to rule out clot.  See me again when you get back

## 2016-07-30 ENCOUNTER — Ambulatory Visit (INDEPENDENT_AMBULATORY_CARE_PROVIDER_SITE_OTHER): Payer: Federal, State, Local not specified - PPO | Admitting: Internal Medicine

## 2016-07-30 ENCOUNTER — Encounter: Payer: Self-pay | Admitting: Internal Medicine

## 2016-07-30 ENCOUNTER — Ambulatory Visit (HOSPITAL_COMMUNITY)
Admission: RE | Admit: 2016-07-30 | Discharge: 2016-07-30 | Disposition: A | Discharge status: Home / Self Care | Payer: Federal, State, Local not specified - PPO | Source: Ambulatory Visit | Attending: Cardiovascular Disease | Admitting: Cardiovascular Disease

## 2016-07-30 DIAGNOSIS — Z85850 Personal history of malignant neoplasm of thyroid: Secondary | ICD-10-CM | POA: Insufficient documentation

## 2016-07-30 DIAGNOSIS — E559 Vitamin D deficiency, unspecified: Secondary | ICD-10-CM | POA: Diagnosis not present

## 2016-07-30 DIAGNOSIS — Z0000 Encounter for general adult medical examination without abnormal findings: Secondary | ICD-10-CM | POA: Diagnosis not present

## 2016-07-30 DIAGNOSIS — E890 Postprocedural hypothyroidism: Secondary | ICD-10-CM | POA: Diagnosis not present

## 2016-07-30 DIAGNOSIS — I10 Essential (primary) hypertension: Secondary | ICD-10-CM

## 2016-07-30 DIAGNOSIS — J4520 Mild intermittent asthma, uncomplicated: Secondary | ICD-10-CM

## 2016-07-30 DIAGNOSIS — M79605 Pain in left leg: Secondary | ICD-10-CM | POA: Insufficient documentation

## 2016-07-30 MED ORDER — CYCLOBENZAPRINE HCL 5 MG PO TABS: 5.0000 mg | ORAL_TABLET | Freq: Every day | ORAL | 2 refills | Status: DC

## 2016-07-30 MED ORDER — EPINEPHRINE 0.3 MG/0.3ML IJ SOAJ
0.3000 mg | INTRAMUSCULAR | 3 refills | Status: DC | PRN
Start: 2016-07-30 — End: 2017-07-08

## 2016-07-30 NOTE — Progress Notes (Signed)
Subjective:  Patient ID: Laurie Hawkins, female    DOB: 18-Jan-1970  Age: 46 y.o. MRN: [id number]  CC: No chief complaint on file.   HPI Laurie Hawkins presents for a well exam C/o tingling in upper legs Doppler US pending  Outpatient Medications Prior to Visit  Medication Sig Dispense Refill  . albuterol (PROVENTIL HFA;VENTOLIN HFA) 108 (90 BASE) MCG/ACT inhaler Inhale into the lungs every 6 (six) hours as needed for wheezing or shortness of breath.    . cetirizine (ZYRTEC) 10 MG tablet Take 10 mg by mouth daily.    . cyclobenzaprine (FLEXERIL) 5 MG tablet Take 1-2 tablets (5-10 mg total) by mouth at bedtime. 90 tablet 2  . diphenhydrAMINE (BENADRYL) 25 MG tablet Take 2 tablets (50 mg total) by mouth every 4 (four) hours as needed for allergies. 60 tablet 3  . EPINEPHrine 0.3 mg/0.3 mL IJ SOAJ injection Inject 0.3 mLs (0.3 mg total) into the muscle as needed. 1 Device 3  . fluticasone (FLONASE) 50 MCG/ACT nasal spray Place 1 spray into both nostrils daily.    . Fluticasone-Salmeterol (ADVAIR) 250-50 MCG/DOSE AEPB Inhale 1 puff into the lungs daily.    . IRON PO Take 210 mg by mouth daily.     Marland Kitchen levothyroxine (SYNTHROID, LEVOTHROID) 125 MCG tablet Take 125 mcg by mouth daily.     . Multiple Vitamin (MULTIVITAMIN) tablet Take 1 tablet by mouth daily.    . NON FORMULARY BIFRIZIDE (FOR HTN)  1 TAB DAILY    . Olopatadine HCl (PAZEO) 0.7 % SOLN Apply to eye as needed.     . Phosphatidylserine-DHA-EPA (VAYACOG) 100-19.5-6.5 MG CAPS Take 1 capsule by mouth daily. 30 capsule 11  . predniSONE (DELTASONE) 50 MG tablet Take 1 tablet (50 mg total) by mouth daily. 5 tablet 0  . Probiotic CAPS Take 2 capsules by mouth daily.    Marland Kitchen Propylene Glycol (SYSTANE BALANCE OP) Apply 1 drop to eye as needed.     . pseudoephedrine (SUDAFED) 30 MG tablet Take 2 tablets (60 mg total) by mouth every 4 (four) hours as needed for congestion. 30 tablet 0  . traMADol (ULTRAM) 50 MG tablet Take 1 tablet (50 mg total)  by mouth every 8 (eight) hours as needed. 30 tablet 0  . UNABLE TO FIND Take 1 tablet by mouth daily. Zofenopril/HCTZ 30/12.5mg     . Cholecalciferol (VITAMIN D3) 2000 UNITS capsule Take 1 capsule (2,000 Units total) by mouth daily. 100 capsule 3   No facility-administered medications prior to visit.     ROS Review of Systems  Constitutional: Negative for activity change, appetite change, chills, fatigue and unexpected weight change.  HENT: Negative for congestion, mouth sores and sinus pressure.   Eyes: Negative for visual disturbance.  Respiratory: Negative for cough and chest tightness.   Gastrointestinal: Negative for abdominal pain and nausea.  Genitourinary: Negative for difficulty urinating, frequency and vaginal pain.  Musculoskeletal: Negative for back pain and gait problem.  Skin: Negative for pallor and rash.  Neurological: Negative for dizziness, tremors, weakness, numbness and headaches.  Psychiatric/Behavioral: Negative for confusion and sleep disturbance.    Objective:  BP 128/82 (BP Location: Left Arm, Patient Position: Sitting, Cuff Size: Large)   Pulse 72   Temp 98.1 F (36.7 C) (Oral)   Ht 5\' 9"  (1.753 m)   Wt 238 lb (108 kg)   SpO2 99%   BMI 35.15 kg/m   BP Readings from Last 3 Encounters:  07/30/16 128/82  07/29/16 Marland Kitchen)  144/94  01/27/16 135/82    Wt Readings from Last 3 Encounters:  07/30/16 238 lb (108 kg)  07/29/16 237 lb (107.5 kg)  01/27/16 233 lb 6.4 oz (105.9 kg)    Physical Exam  Constitutional: She appears well-developed. No distress.  HENT:  Head: Normocephalic.  Right Ear: External ear normal.  Left Ear: External ear normal.  Nose: Nose normal.  Mouth/Throat: Oropharynx is clear and moist.  Eyes: Pupils are equal, round, and reactive to light. Conjunctivae are normal. Right eye exhibits no discharge. Left eye exhibits no discharge.  Neck: Normal range of motion. Neck supple. No JVD present. No tracheal deviation present. No thyromegaly  present.  Cardiovascular: Normal rate, regular rhythm and normal heart sounds.   Pulmonary/Chest: No stridor. No respiratory distress. She has no wheezes.  Abdominal: Soft. Bowel sounds are normal. She exhibits no distension and no mass. There is no tenderness. There is no rebound and no guarding.  Musculoskeletal: She exhibits no edema or tenderness.  Lymphadenopathy:    She has no cervical adenopathy.  Neurological: She displays normal reflexes. No cranial nerve deficit. She exhibits normal muscle tone. Coordination normal.  Skin: No rash noted. No erythema.  Psychiatric: She has a normal mood and affect. Her behavior is normal. Judgment and thought content normal.    Patella brace on L knee - painful w/ROM   Lab Results  Component Value Date   WBC 6.6 07/29/2016   HGB 13.8 07/29/2016   HCT 41.1 07/29/2016   PLT 231.0 07/29/2016   GLUCOSE 91 07/29/2016   CHOL 187 07/29/2016   TRIG 130.0 07/29/2016   HDL 64.90 07/29/2016   LDLCALC 96 07/29/2016   ALT 41 (H) 07/29/2016   AST 29 07/29/2016   NA 139 07/29/2016   K 3.7 07/29/2016   CL 103 07/29/2016   CREATININE 1.16 07/29/2016   BUN 10 07/29/2016   CO2 30 07/29/2016   TSH 2.92 07/29/2016    US Renal  Result Date: 08/01/2015 CLINICAL DATA:  Elevated creatinine, possible obstruction EXAM: RENAL / URINARY TRACT ULTRASOUND COMPLETE COMPARISON:  Abdominal and pelvic CT scan dated August 07, 2013 FINDINGS: Right Kidney: Length: 11.0 cm. There renal cortical echotexture remains slightly lower than that of the adjacent liver. There is no hydronephrosis or parenchymal mass. Left Kidney: Length: 12.3 cm. The renal cortical echotexture is similar to that on the right. There is no hydronephrosis nor mass. Bladder: The partially distended urinary bladder is unremarkable. IMPRESSION: No evidence of obstruction. Mildly increased renal cortical echotexture suggests medical renal disease. Electronically Signed   By: Laurie  Hawkins M.D.   On:  08/01/2015 08:16    Assessment & Plan:   There are no diagnoses linked to this encounter. I am having [name] maintain her cetirizine, Olopatadine HCl, Propylene Glycol (SYSTANE BALANCE OP), levothyroxine, albuterol, multivitamin, NON FORMULARY, Fluticasone-Salmeterol, Phosphatidylserine-DHA-EPA, fluticasone, Probiotic, EPINEPHrine, IRON PO, UNABLE TO FIND, diphenhydrAMINE, pseudoephedrine, cyclobenzaprine, predniSONE, traMADol, Magnesium Cl-Calcium Carbonate (SLOW-MAG PO), vitamin C, and Vitamin D3.  Meds ordered this encounter  Medications  . Magnesium Cl-Calcium Carbonate (SLOW-MAG PO)    Sig: Take 2 tablets by mouth daily.  . Ascorbic Acid (VITAMIN C) 1000 MG tablet    Sig: Take 1,000 mg by mouth daily.  . Cholecalciferol (VITAMIN D3) 5000 units CAPS    Sig: Take by mouth.     Follow-up: No Follow-up on file.  Walker Kehr, MD

## 2016-07-30 NOTE — Assessment & Plan Note (Signed)
Advair Proair

## 2016-07-30 NOTE — Assessment & Plan Note (Signed)
ACE/diuretic combo from [place]

## 2016-07-30 NOTE — Patient Instructions (Addendum)
?  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

## 2016-07-30 NOTE — Assessment & Plan Note (Signed)
On Vit D 

## 2016-07-30 NOTE — Assessment & Plan Note (Signed)
We discussed age appropriate health related issues, including available/recomended screening tests and vaccinations. We discussed a need for adhering to healthy diet and exercise. Labs/EKG were reviewed/ordered. All questions were answered.   

## 2016-07-30 NOTE — Assessment & Plan Note (Signed)
On Levothyroxine 

## 2016-12-10 ENCOUNTER — Telehealth: Payer: Self-pay | Admitting: Family Medicine

## 2016-12-10 NOTE — Telephone Encounter (Signed)
Copied from [place] [id number]. Topic: General - Other >> Dec 10, 2016  3:33 PM Valla Leaver wrote: Reason for CRM: Would like a call back RE left knee brace prescribed in July. Patient lives in [place].

## 2016-12-13 NOTE — Telephone Encounter (Signed)
Spoke with patient. She wanted to know why she was being billed for two braces. Explained to patient that the [employer] bills out under two separate codes. Patient voices understanding.

## 2017-05-10 ENCOUNTER — Encounter: Payer: Self-pay | Admitting: Internal Medicine

## 2017-07-06 NOTE — Progress Notes (Signed)
[name] [address] [place], [place] [address] Phone: [phone] Subjective:    CC: Right foot pain  HEN:[id number]  Laurie Hawkins is a 47 y.o. female coming in with complaint of right foot pain. States she has some knots on the medial side of her foot. Feet and legs have had numbness and tingling. There is discoloration in that area. States they are not inflamed as much as they were but there is a very distinct lump at the bottom of her foot. Sometimes the bottom of her foot is tight. Normally wears orthotics.   Onset- Chronic Location- bottom of the foot on the medial side Duration- All day pain. Most intense at night.  Character- Burning, sharp, constant Aggravating factors- flexion Severity-7 out of 10     Past Medical History:  Diagnosis Date  . Hypertension    Past Surgical History:  Procedure Laterality Date  . CESAREAN SECTION  2009  . THYROIDECTOMY  2011   Social History   Socioeconomic History  . Marital status: Married    Spouse name: Not on file  . Number of children: Not on file  . Years of education: Not on file  . Highest education level: Not on file  Occupational History  . Not on file  Social Needs  . Financial resource strain: Not on file  . Food insecurity:    Worry: Not on file    Inability: Not on file  . Transportation needs:    Medical: Not on file    Non-medical: Not on file  Tobacco Use  . Smoking status: Never Smoker  . Smokeless tobacco: Never Used  Substance and Sexual Activity  . Alcohol use: Yes    Alcohol/week: 0.0 oz  . Drug use: No  . Sexual activity: Yes    Birth control/protection: Condom    Comment: DECLINED INSURANCE QUESTIONS  Lifestyle  . Physical activity:    Days per week: Not on file    Minutes per session: Not on file  . Stress: Not on file  Relationships  . Social connections:    Talks on phone: Not on file    Gets together: Not on file    Attends religious service: Not  on file    Active member of club or organization: Not on file    Attends meetings of clubs or organizations: Not on file    Relationship status: Not on file  Other Topics Concern  . Not on file  Social History Narrative  . Not on file   Allergies  Allergen Reactions  . Bee Venom Anaphylaxis   Family History  Problem Relation Age of Onset  . Arthritis Other   . Alcohol abuse Other   . Cancer Other        Breast and Prostate  . Kidney disease Other   . Mental illness Other   . Diabetes Other   . Sarcoidosis Other   . Rheum arthritis Other      Past medical history, social, surgical and family history all reviewed in electronic medical record.  No pertanent information unless stated regarding to the chief complaint.   Review of Systems:Review of systems updated and as accurate as of 07/07/17  No headache, visual changes, nausea, vomiting, diarrhea, constipation, dizziness, abdominal pain, skin rash, fevers, chills, night sweats, weight loss, swollen lymph nodes, body aches, joint swelling,  chest pain, shortness of breath, mood changes.  Positive muscle  Objective  Blood pressure 110/74, pulse 75,  height 5\' 9"  (1.753 m), weight 229 lb (103.9 kg), SpO2 98 %. Systems examined below as of 07/07/17   General: No apparent distress alert and oriented x3 mood and affect normal, dressed appropriately.  HEENT: Pupils equal, extraocular movements intact  Respiratory: Patient's speak in full sentences and does not appear short of breath  Cardiovascular: No lower extremity edema, non tender, no erythema  Skin: Warm dry intact with no signs of infection or rash on extremities or on axial skeleton.  Abdomen: Soft nontender  Neuro: Cranial nerves II through XII are intact, neurovascularly intact in all extremities with 2+ DTRs and 2+ pulses.  Lymph: No lymphadenopathy of posterior or anterior cervical chain or axillae bilaterally.  Gait normal with good balance and coordination.  MSK:  Non  tender with full range of motion and good stability and symmetric strength and tone of shoulders, elbows, wrist, hip, knee and ankles bilaterally.  Right foot exam on the plantar aspect is severely tender.  Patient does have some discoloration.  Appears to be capillary in nature.  Limited musculoskeletal ultrasound was performed and interpreted Lyndal Pulley  Patient plantar aspect of the foot does have what appears to be good capillary centimeters.  Mild swelling to the soft tissue.  No masses appreciated. Impression: Capillary rupture    Impression and Recommendations:     This case required medical decision making of moderate complexity.      Note: This dictation was prepared with Dragon dictation along with smaller phrase technology. Any transcriptional errors that result from this process are unintentional.

## 2017-07-07 ENCOUNTER — Ambulatory Visit: Payer: Federal, State, Local not specified - PPO | Admitting: Family Medicine

## 2017-07-07 ENCOUNTER — Encounter: Payer: Self-pay | Admitting: Family Medicine

## 2017-07-07 ENCOUNTER — Ambulatory Visit: Payer: Self-pay

## 2017-07-07 VITALS — BP 110/74 | HR 75 | Ht 69.0 in | Wt 229.0 lb

## 2017-07-07 DIAGNOSIS — M79671 Pain in right foot: Secondary | ICD-10-CM | POA: Diagnosis not present

## 2017-07-07 DIAGNOSIS — I788 Other diseases of capillaries: Secondary | ICD-10-CM | POA: Insufficient documentation

## 2017-07-07 NOTE — Assessment & Plan Note (Signed)
Capillary hemorrhage of the foot.  Discussed OTC spenco If no resolution follow up 4 weeks

## 2017-07-07 NOTE — Patient Instructions (Signed)
Arnica lotion 2 times a day  Aspirin 2 baby aspirin daily for 2 weeks See me again when you need me

## 2017-07-08 ENCOUNTER — Ambulatory Visit: Payer: Self-pay | Admitting: Internal Medicine

## 2017-07-08 ENCOUNTER — Ambulatory Visit (INDEPENDENT_AMBULATORY_CARE_PROVIDER_SITE_OTHER)
Admission: RE | Admit: 2017-07-08 | Discharge: 2017-07-08 | Disposition: A | Discharge status: Home / Self Care | Payer: Federal, State, Local not specified - PPO | Source: Ambulatory Visit | Attending: Internal Medicine | Admitting: Internal Medicine

## 2017-07-08 ENCOUNTER — Other Ambulatory Visit (INDEPENDENT_AMBULATORY_CARE_PROVIDER_SITE_OTHER): Payer: Federal, State, Local not specified - PPO

## 2017-07-08 ENCOUNTER — Encounter: Payer: Self-pay | Admitting: Internal Medicine

## 2017-07-08 ENCOUNTER — Ambulatory Visit (INDEPENDENT_AMBULATORY_CARE_PROVIDER_SITE_OTHER): Payer: Federal, State, Local not specified - PPO | Admitting: Internal Medicine

## 2017-07-08 VITALS — BP 112/74 | HR 67 | Temp 97.9°F | Ht 69.0 in | Wt 229.0 lb

## 2017-07-08 DIAGNOSIS — J4520 Mild intermittent asthma, uncomplicated: Secondary | ICD-10-CM | POA: Diagnosis not present

## 2017-07-08 DIAGNOSIS — T50905A Adverse effect of unspecified drugs, medicaments and biological substances, initial encounter: Secondary | ICD-10-CM | POA: Insufficient documentation

## 2017-07-08 DIAGNOSIS — Z0000 Encounter for general adult medical examination without abnormal findings: Secondary | ICD-10-CM

## 2017-07-08 DIAGNOSIS — Z91030 Bee allergy status: Secondary | ICD-10-CM | POA: Diagnosis not present

## 2017-07-08 DIAGNOSIS — E559 Vitamin D deficiency, unspecified: Secondary | ICD-10-CM

## 2017-07-08 DIAGNOSIS — E890 Postprocedural hypothyroidism: Secondary | ICD-10-CM

## 2017-07-08 DIAGNOSIS — I10 Essential (primary) hypertension: Secondary | ICD-10-CM

## 2017-07-08 LAB — URINALYSIS
Bilirubin Urine: NEGATIVE
Hgb urine dipstick: NEGATIVE
Ketones, ur: NEGATIVE
Leukocytes, UA: NEGATIVE
NITRITE: NEGATIVE
PH: 6.5 (ref 5.0–8.0)
Specific Gravity, Urine: 1.02 (ref 1.000–1.030)
Total Protein, Urine: NEGATIVE
Urine Glucose: NEGATIVE
Urobilinogen, UA: 0.2 (ref 0.0–1.0)

## 2017-07-08 LAB — BASIC METABOLIC PANEL
BUN: 12 mg/dL (ref 6–23)
CO2: 32 meq/L (ref 19–32)
Calcium: 9.2 mg/dL (ref 8.4–10.5)
Chloride: 101 mEq/L (ref 96–112)
Creatinine, Ser: 1.11 mg/dL (ref 0.40–1.20)
GFR: 67.6 mL/min (ref >60.00)
Glucose, Bld: 92 mg/dL (ref 70–99)
POTASSIUM: 3.2 meq/L — AB (ref 3.5–5.1)
Sodium: 141 mEq/L (ref 135–145)

## 2017-07-08 LAB — LIPID PANEL
CHOLESTEROL: 161 mg/dL (ref 0–200)
HDL: 52.6 mg/dL (ref >39.00)
LDL Cholesterol: 93 mg/dL (ref 0–99)
NonHDL: 108.13
Total CHOL/HDL Ratio: 3
Triglycerides: 74 mg/dL (ref 0.0–149.0)
VLDL: 14.8 mg/dL (ref 0.0–40.0)

## 2017-07-08 LAB — CBC WITH DIFFERENTIAL/PLATELET
BASOS PCT: 0.7 % (ref 0.0–3.0)
Basophils Absolute: 0 10*3/uL (ref 0.0–0.1)
EOS PCT: 3.7 % (ref 0.0–5.0)
Eosinophils Absolute: 0.2 10*3/uL (ref 0.0–0.7)
HCT: 39 % (ref 36.0–46.0)
HEMOGLOBIN: 13.1 g/dL (ref 12.0–15.0)
LYMPHS ABS: 2.6 10*3/uL (ref 0.7–4.0)
Lymphocytes Relative: 40.6 % (ref 12.0–46.0)
MCHC: 33.6 g/dL (ref 30.0–36.0)
MCV: 86.8 fl (ref 78.0–100.0)
MONOS PCT: 8.6 % (ref 3.0–12.0)
Monocytes Absolute: 0.6 10*3/uL (ref 0.1–1.0)
Neutro Abs: 3 10*3/uL (ref 1.4–7.7)
Neutrophils Relative %: 46.4 % (ref 43.0–77.0)
Platelets: 251 10*3/uL (ref 150.0–400.0)
RBC: 4.49 Mil/uL (ref 3.87–5.11)
RDW: 13.6 % (ref 11.5–15.5)
WBC: 6.4 10*3/uL (ref 4.0–10.5)

## 2017-07-08 LAB — HEPATIC FUNCTION PANEL
ALT: 32 U/L (ref 0–35)
AST: 30 U/L (ref 0–37)
Albumin: 4.2 g/dL (ref 3.5–5.2)
Alkaline Phosphatase: 44 U/L (ref 39–117)
BILIRUBIN DIRECT: 0.1 mg/dL (ref 0.0–0.3)
TOTAL PROTEIN: 6.9 g/dL (ref 6.0–8.3)
Total Bilirubin: 0.3 mg/dL (ref 0.2–1.2)

## 2017-07-08 LAB — TSH: TSH: 0.04 u[IU]/mL — ABNORMAL LOW (ref 0.35–4.50)

## 2017-07-08 MED ORDER — AMLODIPINE BESYLATE 5 MG PO TABS: 5.0000 mg | ORAL_TABLET | Freq: Every day | ORAL | 3 refills | Status: DC

## 2017-07-08 MED ORDER — INDAPAMIDE 2.5 MG PO TABS
2.5000 mg | ORAL_TABLET | Freq: Every day | ORAL | 3 refills | Status: DC
Start: 2017-07-08 — End: 2021-07-31

## 2017-07-08 MED ORDER — PSEUDOEPHEDRINE HCL 30 MG PO TABS: 60.0000 mg | ORAL_TABLET | ORAL | 0 refills | Status: DC | PRN

## 2017-07-08 MED ORDER — PREDNISONE 50 MG PO TABS: 50.0000 mg | ORAL_TABLET | Freq: Every day | ORAL | 0 refills | Status: DC

## 2017-07-08 MED ORDER — EPINEPHRINE 0.3 MG/0.3ML IJ SOAJ: 0.3000 mg | INTRAMUSCULAR | 3 refills | Status: DC | PRN

## 2017-07-08 MED ORDER — DIPHENHYDRAMINE HCL 25 MG PO TABS: 50.0000 mg | ORAL_TABLET | ORAL | 3 refills | Status: DC | PRN

## 2017-07-08 MED ORDER — CETIRIZINE HCL 10 MG PO TABS: 10.0000 mg | ORAL_TABLET | Freq: Every day | ORAL | 3 refills | Status: AC

## 2017-07-08 MED ORDER — ONE-DAILY MULTI VITAMINS PO TABS: 1.0000 | ORAL_TABLET | Freq: Every day | ORAL | 3 refills | Status: DC

## 2017-07-08 MED ORDER — FLUTICASONE PROPIONATE 50 MCG/ACT NA SUSP: 1.0000 | Freq: Every day | NASAL | 11 refills | Status: AC

## 2017-07-08 MED ORDER — VITAMIN D3 125 MCG (5000 UT) PO CAPS: ORAL_CAPSULE | ORAL | 3 refills | Status: AC

## 2017-07-08 MED ORDER — PROBIOTIC PO CAPS: 2.0000 | ORAL_CAPSULE | Freq: Every day | ORAL | 3 refills | Status: DC

## 2017-07-08 MED ORDER — CYCLOBENZAPRINE HCL 5 MG PO TABS
5.0000 mg | ORAL_TABLET | Freq: Every day | ORAL | 2 refills | Status: DC
Start: 2017-07-08 — End: 2021-07-31

## 2017-07-08 NOTE — Assessment & Plan Note (Signed)
Benadryl 50 mg, Sudafed 60 mg, Prednisone 40 mg prn Epi-pen

## 2017-07-08 NOTE — Progress Notes (Signed)
Subjective:  Patient ID: Laurie Hawkins, female    DOB: [birth year]  Age: 47 y.o. MRN: [id number]  CC: No chief complaint on file.   HPI Laurie Hawkins presents for a well exam C/o allergies Quillian Quince had a reaction back in [place] when she was returning home by train when she is suddenly developed tingling in the lips, chest pain, shortness of breath and hives that she initially attributed to a bee sting reaction.  However, there was no  insect sting trace anywhere on her body.  She took her emergency kit with Sudafed, Benadryl and prednisone and has improved.  The symptoms have returned a couple times with much lesser intensity.  Complains of dry cough.  She has been on a new combo blood pressure medicine containing an ACE inhibitor, amlodipine, diuretic.  Outpatient Medications Prior to Visit  Medication Sig Dispense Refill  . albuterol (PROVENTIL HFA;VENTOLIN HFA) 108 (90 BASE) MCG/ACT inhaler Inhale into the lungs every 6 (six) hours as needed for wheezing or shortness of breath.    . Ascorbic Acid (VITAMIN C) 1000 MG tablet Take 1,000 mg by mouth daily.    . cetirizine (ZYRTEC) 10 MG tablet Take 10 mg by mouth daily.    . Cholecalciferol (VITAMIN D3) 5000 units CAPS Take by mouth.    . cyclobenzaprine (FLEXERIL) 5 MG tablet Take 1-2 tablets (5-10 mg total) by mouth at bedtime. 90 tablet 2  . diphenhydrAMINE (BENADRYL) 25 MG tablet Take 2 tablets (50 mg total) by mouth every 4 (four) hours as needed for allergies. 60 tablet 3  . EPINEPHrine 0.3 mg/0.3 mL IJ SOAJ injection Inject 0.3 mLs (0.3 mg total) into the muscle as needed. 1 Device 3  . fluticasone (FLONASE) 50 MCG/ACT nasal spray Place 1 spray into both nostrils daily.    . Fluticasone-Salmeterol (ADVAIR) 250-50 MCG/DOSE AEPB Inhale 1 puff into the lungs daily.    . IRON PO Take 210 mg by mouth daily.     Marland Kitchen levothyroxine (SYNTHROID, LEVOTHROID) 125 MCG tablet Take 125 mcg by mouth daily.     . Magnesium Cl-Calcium Carbonate (SLOW-MAG  PO) Take 2 tablets by mouth daily.    . Multiple Vitamin (MULTIVITAMIN) tablet Take 1 tablet by mouth daily.    . NON FORMULARY BIFRIZIDE (FOR HTN)  1 TAB DAILY    . Olopatadine HCl (PAZEO) 0.7 % SOLN Apply to eye as needed.     . Phosphatidylserine-DHA-EPA (VAYACOG) 100-19.5-6.5 MG CAPS Take 1 capsule by mouth daily. 30 capsule 11  . predniSONE (DELTASONE) 50 MG tablet Take 1 tablet (50 mg total) by mouth daily. 5 tablet 0  . Probiotic CAPS Take 2 capsules by mouth daily.    Marland Kitchen Propylene Glycol (SYSTANE BALANCE OP) Apply 1 drop to eye as needed.     . pseudoephedrine (SUDAFED) 30 MG tablet Take 2 tablets (60 mg total) by mouth every 4 (four) hours as needed for congestion. 30 tablet 0  . traMADol (ULTRAM) 50 MG tablet Take 1 tablet (50 mg total) by mouth every 8 (eight) hours as needed. 30 tablet 0  . UNABLE TO FIND Take 1 tablet by mouth daily. Zofenopril/HCTZ 30/12.81m    . UNABLE TO FIND Med Name: Laurie Quaker10/2,5/10     No facility-administered medications prior to visit.     ROS: Review of Systems  Constitutional: Negative for activity change, appetite change, chills, fatigue and unexpected weight change.  HENT: Negative for congestion, mouth sores and sinus pressure.   Eyes: Negative  for visual disturbance.  Respiratory: Positive for cough. Negative for chest tightness.   Cardiovascular: Positive for chest pain.  Gastrointestinal: Negative for abdominal pain and nausea.  Genitourinary: Negative for difficulty urinating, frequency and vaginal pain.  Musculoskeletal: Negative for back pain and gait problem.  Skin: Negative for pallor and rash.  Neurological: Negative for dizziness, tremors, weakness, numbness and headaches.  Psychiatric/Behavioral: Negative for confusion, sleep disturbance and suicidal ideas.    Objective:  BP 112/74 (BP Location: Left Arm, Patient Position: Sitting, Cuff Size: Large)   Pulse 67   Temp 97.9 F (36.6 C) (Oral)   Ht _0  (1.753 m)   Wt 229 lb  (103.9 kg)   SpO2 98%   BMI 33.82 kg/m   BP Readings from Last 3 Encounters:  07/08/17 112/74  07/07/17 110/74  07/30/16 128/82    Wt Readings from Last 3 Encounters:  07/08/17 229 lb (103.9 kg)  07/07/17 229 lb (103.9 kg)  07/30/16 238 lb (108 kg)    Physical Exam  Constitutional: She appears well-developed. No distress.  HENT:  Head: Normocephalic.  Right Ear: External ear normal.  Left Ear: External ear normal.  Nose: Nose normal.  Mouth/Throat: Oropharynx is clear and moist.  Eyes: Pupils are equal, round, and reactive to light. Conjunctivae are normal. Right eye exhibits no discharge. Left eye exhibits no discharge.  Neck: Normal range of motion. Neck supple. No JVD present. No tracheal deviation present. No thyromegaly present.  Cardiovascular: Normal rate, regular rhythm and normal heart sounds.  Pulmonary/Chest: No stridor. No respiratory distress. She has no wheezes.  Abdominal: Soft. Bowel sounds are normal. She exhibits no distension and no mass. There is no tenderness. There is no rebound and no guarding.  Musculoskeletal: She exhibits no edema or tenderness.  Lymphadenopathy:    She has no cervical adenopathy.  Neurological: She displays normal reflexes. No cranial nerve deficit. She exhibits normal muscle tone. Coordination normal.  Skin: No rash noted. No erythema.  Psychiatric: She has a normal mood and affect. Her behavior is normal. Judgment and thought content normal.    Lab Results  Component Value Date   WBC 6.6 07/29/2016   HGB 13.8 07/29/2016   HCT 41.1 07/29/2016   PLT 231.0 07/29/2016   GLUCOSE 91 07/29/2016   CHOL 187 07/29/2016   TRIG 130.0 07/29/2016   HDL 64.90 07/29/2016   LDLCALC 96 07/29/2016   ALT 41 (H) 07/29/2016   AST 29 07/29/2016   NA 139 07/29/2016   K 3.7 07/29/2016   CL 103 07/29/2016   CREATININE 1.16 07/29/2016   BUN 10 07/29/2016   CO2 30 07/29/2016   TSH 2.92 07/29/2016    No results found.  Assessment & Plan:     There are no diagnoses linked to this encounter.   No orders of the defined types were placed in this encounter.    Follow-up: No follow-ups on file.  Walker Kehr, MD

## 2017-07-08 NOTE — Assessment & Plan Note (Addendum)
Levothyroxine  labs

## 2017-07-08 NOTE — Patient Instructions (Signed)
ACE inhibitors and angioedema

## 2017-07-08 NOTE — Assessment & Plan Note (Signed)
CXR

## 2017-07-08 NOTE — Assessment & Plan Note (Signed)
Amlodipine diuretic

## 2017-07-08 NOTE — Assessment & Plan Note (Signed)
We discussed age appropriate health related issues, including available/recomended screening tests and vaccinations. We discussed a need for adhering to healthy diet and exercise. Labs were ordered to be later reviewed . All questions were answered.   

## 2017-07-08 NOTE — Assessment & Plan Note (Signed)
Vit D 

## 2017-07-08 NOTE — Assessment & Plan Note (Signed)
D/c ACE See BP med change

## 2017-07-10 ENCOUNTER — Other Ambulatory Visit: Payer: Self-pay | Admitting: Internal Medicine

## 2017-07-10 MED ORDER — LEVOTHYROXINE SODIUM 100 MCG PO TABS: 100.0000 ug | ORAL_TABLET | Freq: Every day | ORAL | 3 refills | Status: AC

## 2017-07-10 MED ORDER — POTASSIUM CHLORIDE ER 10 MEQ PO TBCR
10.0000 meq | EXTENDED_RELEASE_TABLET | Freq: Every day | ORAL | 3 refills | Status: DC
Start: 2017-07-10 — End: 2021-07-31

## 2017-07-11 ENCOUNTER — Encounter: Payer: Self-pay | Admitting: Women's Health

## 2017-07-11 ENCOUNTER — Other Ambulatory Visit: Payer: Self-pay | Admitting: Women's Health

## 2017-07-11 ENCOUNTER — Ambulatory Visit: Payer: Federal, State, Local not specified - PPO | Admitting: Women's Health

## 2017-07-11 VITALS — BP 124/80 | Ht 69.0 in | Wt 230.0 lb

## 2017-07-11 DIAGNOSIS — E038 Other specified hypothyroidism: Secondary | ICD-10-CM

## 2017-07-11 DIAGNOSIS — Z1231 Encounter for screening mammogram for malignant neoplasm of breast: Secondary | ICD-10-CM

## 2017-07-11 DIAGNOSIS — Z01419 Encounter for gynecological examination (general) (routine) without abnormal findings: Secondary | ICD-10-CM

## 2017-07-11 LAB — T4: T4, Total: 11.3 ug/dL (ref 5.1–11.9)

## 2017-07-11 NOTE — Patient Instructions (Signed)

## 2017-07-11 NOTE — Progress Notes (Signed)
Laurie Hawkins [birth year] [id number]    History:    Presents for annual exam.  Cycles every 1 to 2 months, some months heavy flow, condoms.  Normal Pap and mammogram history.  Last mammogram 2018.  Mother breast cancer survivor.  2011 thyroid cancer with thyroidectomy on Synthroid.  Primary care manages hypertension, hypothyroidism and asthma.  TSH. 04.  Currently lives in [place] and has primary care there.  States did have a Pap last year, states they do an ultrasound annually at annual exam.  Past medical history, past surgical history, family history and social history were all reviewed and documented in the EPIC chart.  Works for [employer], job is being eliminated this year.  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  Laurie Hawkins 8 doing well.  ROS:  A ROS was performed and pertinent positives and negatives are included.  Exam:  Vitals:   07/11/17 1431  BP: 124/80  Weight: 230 lb (104.3 kg)  Height: 5\' 9"  (1.753 m)   Body mass index is 33.97 kg/m.   General appearance:  Normal Thyroid:  Symmetrical, normal in size, without palpable masses or nodularity. Respiratory  Auscultation:  Clear without wheezing or rhonchi Cardiovascular  Auscultation:  Regular rate, without rubs, murmurs or gallops  Edema/varicosities:  Not grossly evident Abdominal  Soft,nontender, without masses, guarding or rebound.  Liver/spleen:  No organomegaly noted  Hernia:  None appreciated  Skin  Inspection:  Grossly normal   Breasts: Examined lying and sitting.     Right: Without masses, retractions, discharge or axillary adenopathy.     Left: Without masses, retractions, discharge or axillary adenopathy. Gentitourinary   Inguinal/mons:  Normal without inguinal adenopathy  External genitalia:  Normal  BUS/Urethra/Skene's glands:  Normal  Vagina:  Normal  Cervix:  Normal  Uterus:  normal in size, shape and contour.  Midline and mobile  Adnexa/parametria:     Rt: Without masses or  tenderness.   Lt: Without masses or tenderness.  Anus and perineum: Normal  Digital rectal exam: Normal sphincter tone without palpated masses or tenderness  Assessment/Plan:  47 y.o. MBF G1, P1 for annual exam with no complaints.  Mostly monthly cycles/condoms Hypertension, hypothyroidism, asthma-primary care manages labs and meds  2011 thyroid cancer on Synthroid Obesity  Plan: Congratulated on 20 pound weight loss in the past year.  Continue healthy lifestyle of regular exercise and low carb/calorie diet.  Vitamin D 1000 daily encouraged.  SBE's, overdue for mammogram instructed to schedule, breast center information given encouraged to schedule prior to returning to [place].  TSH 0.04 we will check a T4, Pap with HR HPV typing, new screening guidelines reviewed.   [place], 4:05 PM 07/11/2017

## 2017-07-11 NOTE — Progress Notes (Signed)
ab 

## 2017-07-12 ENCOUNTER — Encounter (INDEPENDENT_AMBULATORY_CARE_PROVIDER_SITE_OTHER): Payer: Self-pay

## 2017-07-12 LAB — PAP, TP IMAGING W/ HPV RNA, RFLX HPV TYPE 16,18/45: HPV DNA High Risk: NOT DETECTED

## 2017-07-13 ENCOUNTER — Encounter (INDEPENDENT_AMBULATORY_CARE_PROVIDER_SITE_OTHER): Payer: Self-pay

## 2020-07-31 ENCOUNTER — Telehealth: Payer: Self-pay | Admitting: Internal Medicine

## 2020-07-31 NOTE — Telephone Encounter (Signed)
Pt hasn't seen MD since 2019. Pls advise.Marland KitchenJohny Hawkins

## 2020-07-31 NOTE — Telephone Encounter (Signed)
Pt needs refills on the following sent to [facility]   EPINEPHrine 0.3 mg/0.3 mL IJ SOAJ injection pseudoephedrine (SUDAFED) 30 MG tablet diphenhydrAMINE (BENADRYL) 25 MG tablet

## 2020-08-02 MED ORDER — PSEUDOEPHEDRINE HCL 30 MG PO TABS: 60.0000 mg | ORAL_TABLET | ORAL | 0 refills | Status: AC | PRN

## 2020-08-02 MED ORDER — EPINEPHRINE 0.3 MG/0.3ML IJ SOAJ: 0.3000 mg | INTRAMUSCULAR | 3 refills | Status: DC | PRN

## 2020-08-02 MED ORDER — DIPHENHYDRAMINE HCL 25 MG PO TABS: 50.0000 mg | ORAL_TABLET | ORAL | 0 refills | Status: AC | PRN

## 2020-08-02 NOTE — Telephone Encounter (Signed)
Okay.  Done.  Thanks 

## 2020-08-02 NOTE — Addendum Note (Signed)
Addended by: Cassandria Anger on: 08/02/2020 02:15 PM   Modules accepted: Orders

## 2020-10-22 ENCOUNTER — Telehealth: Payer: Self-pay | Admitting: Internal Medicine

## 2020-10-22 DIAGNOSIS — I10 Essential (primary) hypertension: Secondary | ICD-10-CM

## 2020-10-22 DIAGNOSIS — Z0000 Encounter for general adult medical examination without abnormal findings: Secondary | ICD-10-CM

## 2020-10-22 DIAGNOSIS — E038 Other specified hypothyroidism: Secondary | ICD-10-CM

## 2020-10-22 NOTE — Telephone Encounter (Signed)
Patient has cpe scheduled 11/09  Wants provider to put lab orders in before visit   Please let patient know when orders have been placed [phone]

## 2020-10-23 NOTE — Telephone Encounter (Signed)
Notified pt place standard cpx labs. Will go to lab on [address] to have done.Marland KitchenJohny Chess

## 2020-10-24 ENCOUNTER — Other Ambulatory Visit: Payer: Self-pay | Admitting: Internal Medicine

## 2020-10-24 DIAGNOSIS — Z1231 Encounter for screening mammogram for malignant neoplasm of breast: Secondary | ICD-10-CM

## 2020-11-17 NOTE — Progress Notes (Signed)
Laurie Hawkins [name] [address] [place] Phone: [phone] Subjective:   IVilma Hawkins, am serving as a scribe for Dr. Hulan Saas. This visit occurred during the SARS-CoV-2 public health emergency.  Safety protocols were in place, including screening questions prior to the visit, additional usage of staff PPE, and extensive cleaning of exam room while observing appropriate contact time as indicated for disinfecting solutions.   I'm seeing this patient by the request  of:  Plotnikov, Laurie Lacks, MD  CC: Joint pain  [id number]  Laurie Hawkins is a 50 y.o. female coming in with complaint of joint pain. Last seen in 2019 for R foot pain. Patient states she has been having bilateral knee pain, bruising on the medial sides of both feet, back pain (which is subsiding), and middle finger pain on the left hand and right thumb pain. Still currently resides in [place].       Past Medical History:  Diagnosis Date   Hypertension    Past Surgical History:  Procedure Laterality Date   CESAREAN SECTION  2009   THYROIDECTOMY  2011   Social History   Socioeconomic History   Marital status: Married    Spouse name: Not on file   Number of children: Not on file   Years of education: Not on file   Highest education level: Not on file  Occupational History   Not on file  Tobacco Use   Smoking status: Never   Smokeless tobacco: Never  Vaping Use   Vaping Use: Never used  Substance and Sexual Activity   Alcohol use: Yes    Alcohol/week: 0.0 standard drinks   Drug use: No   Sexual activity: Yes    Birth control/protection: Condom    Comment: DECLINED INSURANCE QUESTIONS  Other Topics Concern   Not on file  Social History Narrative   Not on file   Social Determinants of Health   Financial Resource Strain: Not on file  Food Insecurity: Not on file  Transportation Needs: Not on file  Physical Activity: Not on file  Stress: Not on file   Social Connections: Not on file   Allergies  Allergen Reactions   Bee Venom Anaphylaxis   Ace Inhibitors     Angioedema    Family History  Problem Relation Age of Onset   Arthritis Other    Alcohol abuse Other    Cancer Other        Breast and Prostate   Kidney disease Other    Mental illness Other    Diabetes Other    Sarcoidosis Other    Rheum arthritis Other     Current Outpatient Medications (Endocrine & Metabolic):    levothyroxine (SYNTHROID, LEVOTHROID) 100 MCG tablet, Take 1 tablet (100 mcg total) by mouth daily.   predniSONE (DELTASONE) 50 MG tablet, Take 1 tablet (50 mg total) by mouth daily.  Current Outpatient Medications (Cardiovascular):    EPINEPHrine 0.3 mg/0.3 mL IJ SOAJ injection, Inject 0.3 mg into the muscle as needed.   indapamide (LOZOL) 2.5 MG tablet, Take 1 tablet (2.5 mg total) by mouth daily.  Current Outpatient Medications (Respiratory):    albuterol (PROVENTIL HFA;VENTOLIN HFA) 108 (90 BASE) MCG/ACT inhaler, Inhale into the lungs every 6 (six) hours as needed for wheezing or shortness of breath.   cetirizine (ZYRTEC) 10 MG tablet, Take 1 tablet (10 mg total) by mouth daily.   diphenhydrAMINE (BENADRYL) 25 MG tablet, Take 2 tablets (50 mg total) by  mouth every 4 (four) hours as needed for allergies.   fluticasone (FLONASE) 50 MCG/ACT nasal spray, Place 1 spray into both nostrils daily.   Fluticasone-Salmeterol (ADVAIR) 250-50 MCG/DOSE AEPB, Inhale 1 puff into the lungs daily.   pseudoephedrine (SUDAFED) 30 MG tablet, Take 2 tablets (60 mg total) by mouth every 4 (four) hours as needed for congestion.  Current Outpatient Medications (Analgesics):    traMADol (ULTRAM) 50 MG tablet, Take 1 tablet (50 mg total) by mouth every 8 (eight) hours as needed.  Current Outpatient Medications (Hematological):    IRON PO, Take 210 mg by mouth daily.   Current Outpatient Medications (Other):    Ascorbic Acid (VITAMIN C) 1000 MG tablet, Take 1,000 mg by mouth  daily.   Cholecalciferol (VITAMIN D3) 5000 units CAPS, 1 po qd   cyclobenzaprine (FLEXERIL) 5 MG tablet, Take 1-2 tablets (5-10 mg total) by mouth at bedtime.   Magnesium Cl-Calcium Carbonate (SLOW-MAG PO), Take 2 tablets by mouth daily.   Multiple Vitamin (MULTIVITAMIN) tablet, Take 1 tablet by mouth daily.   Olopatadine HCl (PAZEO) 0.7 % SOLN, Apply to eye as needed.    potassium chloride (KLOR-CON 10) 10 MEQ tablet, Take 1 tablet (10 mEq total) by mouth daily.   Probiotic CAPS, Take 2 capsules by mouth daily.   Propylene Glycol (SYSTANE BALANCE OP), Apply 1 drop to eye as needed.    UNABLE TO FIND, Med Name: Jobie Quaker 10/2,5/10   Reviewed prior external information including notes and imaging from  primary care provider As well as notes that were available from care everywhere and other healthcare systems.  Past medical history, social, surgical and family history all reviewed in electronic medical record.  No pertanent information unless stated regarding to the chief complaint.   Review of Systems:  No headache, visual changes, nausea, vomiting, diarrhea, constipation, dizziness, abdominal pain, skin rash, fevers, chills, night sweats, weight loss, swollen lymph nodes, body aches, joint swelling, chest pain, shortness of breath, mood changes. POSITIVE muscle aches  Objective  Blood pressure 132/84, pulse 86, height 5\' 9"  (1.753 m), weight 243 lb (110.2 kg), SpO2 97 %.   General: No apparent distress alert and oriented x3 mood and affect normal, dressed appropriately.  HEENT: Pupils equal, extraocular movements intact  Respiratory: Patient's speak in full sentences and does not appear short of breath  Cardiovascular: No lower extremity edema, non tender, no erythema  Gait normal with good balance and coordination.  MSK: Patient's low back does have some mild loss of lordosis.  Nontender on exam no. Bilateral knee exam shows some mild lateral tracking of the patella with mild positive  patellar grind test.  No significant instability though noted with valgus or varus force. Bilateral foot exam show the patient does have overpronation of the hindfoot.  Breakdown of transverse arch right greater than left.  Limited muscular skeletal ultrasound was performed and interpreted by Hulan Saas, M  Limited ultrasound of patient's posterior tibialis tendon bilaterally shows hypoechoic changes noted but no acute tear.  No increase in neovascularization. Posterior tibialis tendinitis   Impression and Recommendations:     The above documentation has been reviewed and is accurate and complete Lyndal Pulley, DO

## 2020-11-18 ENCOUNTER — Encounter: Payer: Self-pay | Admitting: Family Medicine

## 2020-11-18 ENCOUNTER — Other Ambulatory Visit (INDEPENDENT_AMBULATORY_CARE_PROVIDER_SITE_OTHER): Payer: 59

## 2020-11-18 ENCOUNTER — Ambulatory Visit (INDEPENDENT_AMBULATORY_CARE_PROVIDER_SITE_OTHER): Payer: 59 | Admitting: Family Medicine

## 2020-11-18 ENCOUNTER — Ambulatory Visit: Payer: Self-pay

## 2020-11-18 ENCOUNTER — Other Ambulatory Visit: Payer: Self-pay

## 2020-11-18 VITALS — BP 132/84 | HR 86 | Ht 69.0 in | Wt 243.0 lb

## 2020-11-18 DIAGNOSIS — I10 Essential (primary) hypertension: Secondary | ICD-10-CM

## 2020-11-18 DIAGNOSIS — E038 Other specified hypothyroidism: Secondary | ICD-10-CM

## 2020-11-18 DIAGNOSIS — M76822 Posterior tibial tendinitis, left leg: Secondary | ICD-10-CM

## 2020-11-18 DIAGNOSIS — M545 Low back pain, unspecified: Secondary | ICD-10-CM

## 2020-11-18 DIAGNOSIS — M25579 Pain in unspecified ankle and joints of unspecified foot: Secondary | ICD-10-CM

## 2020-11-18 DIAGNOSIS — Z0000 Encounter for general adult medical examination without abnormal findings: Secondary | ICD-10-CM | POA: Diagnosis not present

## 2020-11-18 DIAGNOSIS — M222X1 Patellofemoral disorders, right knee: Secondary | ICD-10-CM | POA: Diagnosis not present

## 2020-11-18 DIAGNOSIS — M222X2 Patellofemoral disorders, left knee: Secondary | ICD-10-CM

## 2020-11-18 LAB — LIPID PANEL
Cholesterol: 184 mg/dL (ref 0–200)
HDL: 53.5 mg/dL (ref >39.00)
LDL Cholesterol: 114 mg/dL — ABNORMAL HIGH (ref 0–99)
NonHDL: 130.27
Total CHOL/HDL Ratio: 3
Triglycerides: 81 mg/dL (ref 0.0–149.0)
VLDL: 16.2 mg/dL (ref 0.0–40.0)

## 2020-11-18 LAB — BASIC METABOLIC PANEL
BUN: 12 mg/dL (ref 6–23)
CO2: 30 mEq/L (ref 19–32)
Calcium: 9.1 mg/dL (ref 8.4–10.5)
Chloride: 105 mEq/L (ref 96–112)
Creatinine, Ser: 1.15 mg/dL (ref 0.40–1.20)
GFR: 55.4 mL/min — ABNORMAL LOW (ref >60.00)
Glucose, Bld: 91 mg/dL (ref 70–99)
Potassium: 3.9 mEq/L (ref 3.5–5.1)
Sodium: 143 mEq/L (ref 135–145)

## 2020-11-18 LAB — HEPATIC FUNCTION PANEL
ALT: 25 U/L (ref 0–35)
AST: 41 U/L — ABNORMAL HIGH (ref 0–37)
Albumin: 4.2 g/dL (ref 3.5–5.2)
Alkaline Phosphatase: 62 U/L (ref 39–117)
Bilirubin, Direct: 0 mg/dL (ref 0.0–0.3)
Total Bilirubin: 0.4 mg/dL (ref 0.2–1.2)
Total Protein: 7.2 g/dL (ref 6.0–8.3)

## 2020-11-18 LAB — URINALYSIS, ROUTINE W REFLEX MICROSCOPIC
Bilirubin Urine: NEGATIVE
Ketones, ur: NEGATIVE
Nitrite: NEGATIVE
Specific Gravity, Urine: 1.025 (ref 1.000–1.030)
Urine Glucose: NEGATIVE
Urobilinogen, UA: 0.2 (ref 0.0–1.0)
pH: 6 (ref 5.0–8.0)

## 2020-11-18 LAB — CBC WITH DIFFERENTIAL/PLATELET
Basophils Absolute: 0 10*3/uL (ref 0.0–0.1)
Basophils Relative: 0.8 % (ref 0.0–3.0)
Eosinophils Absolute: 0.2 10*3/uL (ref 0.0–0.7)
Eosinophils Relative: 2.8 % (ref 0.0–5.0)
HCT: 39.6 % (ref 36.0–46.0)
Hemoglobin: 13 g/dL (ref 12.0–15.0)
Lymphocytes Relative: 43 % (ref 12.0–46.0)
Lymphs Abs: 2.7 10*3/uL (ref 0.7–4.0)
MCHC: 32.8 g/dL (ref 30.0–36.0)
MCV: 86.5 fl (ref 78.0–100.0)
Monocytes Absolute: 0.5 10*3/uL (ref 0.1–1.0)
Monocytes Relative: 8.4 % (ref 3.0–12.0)
Neutro Abs: 2.8 10*3/uL (ref 1.4–7.7)
Neutrophils Relative %: 45 % (ref 43.0–77.0)
Platelets: 232 10*3/uL (ref 150.0–400.0)
RBC: 4.58 Mil/uL (ref 3.87–5.11)
RDW: 14.6 % (ref 11.5–15.5)
WBC: 6.3 10*3/uL (ref 4.0–10.5)

## 2020-11-18 LAB — TSH: TSH: 2.55 u[IU]/mL (ref 0.35–5.50)

## 2020-11-18 NOTE — Patient Instructions (Signed)
Recovery sandals in house ([place] or [place]) Do prescribed exercises at least 3x a week Tiger balm where it hurts Every pound you lose is 4 lbs to your knees Consider sleep study in [place] if cramping continues Any questions you know where I am. Can contact me through [employer]

## 2020-11-18 NOTE — Assessment & Plan Note (Signed)
Worsening tendinitis again.  Discussed with the heel lifts, home exercises and work with Product/process development scientist.  We discussed with patient about recovery sandals in the house that I think will be beneficial.  Discussed weight loss.  Follow-up again in 4 to 6 weeks

## 2020-11-18 NOTE — Assessment & Plan Note (Signed)
Patient has had an acute flare.  Could be secondary to the alignment secondary to the feet.  Seems to be getting better.  Has had Flexeril previously in the past and can potentially use it.  Given some core stability exercises that I think will be helpful.  Encourage weight loss and follow-up with me again if she is back in town.

## 2020-11-18 NOTE — Assessment & Plan Note (Signed)
Patient has gained some weight since her last visit.  Discussed with patient that I do feel that that could be contributing to more of the knee.  We discussed proper shoes that will also help out significantly.  Discussed icing regimen and home exercises.  Patient wanted to hold on any type of injection today but can consider that in the future if necessary.

## 2020-11-19 ENCOUNTER — Ambulatory Visit (INDEPENDENT_AMBULATORY_CARE_PROVIDER_SITE_OTHER): Payer: 59 | Admitting: Internal Medicine

## 2020-11-19 ENCOUNTER — Encounter: Payer: Self-pay | Admitting: Internal Medicine

## 2020-11-19 ENCOUNTER — Ambulatory Visit: Payer: Self-pay | Admitting: Family Medicine

## 2020-11-19 VITALS — BP 118/70 | HR 93 | Temp 98.4°F | Ht 69.0 in | Wt 242.0 lb

## 2020-11-19 DIAGNOSIS — E890 Postprocedural hypothyroidism: Secondary | ICD-10-CM

## 2020-11-19 DIAGNOSIS — Z0000 Encounter for general adult medical examination without abnormal findings: Secondary | ICD-10-CM | POA: Diagnosis not present

## 2020-11-19 DIAGNOSIS — N3090 Cystitis, unspecified without hematuria: Secondary | ICD-10-CM

## 2020-11-19 DIAGNOSIS — R1313 Dysphagia, pharyngeal phase: Secondary | ICD-10-CM

## 2020-11-19 DIAGNOSIS — R1310 Dysphagia, unspecified: Secondary | ICD-10-CM | POA: Insufficient documentation

## 2020-11-19 MED ORDER — PANTOPRAZOLE SODIUM 40 MG PO TBEC: 40.0000 mg | DELAYED_RELEASE_TABLET | Freq: Every day | ORAL | 3 refills | Status: DC

## 2020-11-19 MED ORDER — CEPHALEXIN 500 MG PO CAPS: 500.0000 mg | ORAL_CAPSULE | Freq: Four times a day (QID) | ORAL | 0 refills | Status: DC

## 2020-11-19 NOTE — Patient Instructions (Addendum)
Esophageal Stricture Esophageal stricture is a narrowing of the esophagus. The esophagus is the part of the body that moves food and liquid from your mouth to your stomach. The esophagus can become narrow because of disease or damage to the area. This condition can make swallowing difficult, painful, or even impossible. It also makes choking more likely. What are the causes? The most common cause of this condition is gastroesophageal reflux disease (GERD). Normally, food travels down the esophagus and stays in the stomach to be digested. In GERD, food and stomach acid move back up into the esophagus. Over time, this causes scar tissue and leads to narrowing. Other causes of esophageal stricture include: Scarring from swallowing a harmful substance. Damage from medical instruments used in the esophagus. Radiation therapy. Cancer. Inflammation of the esophagus. What increases the risk? You are more likely to develop an esophageal stricture if you have GERD or esophageal cancer. What are the signs or symptoms? Symptoms of this condition include: Difficulty swallowing. Pain when swallowing. Burning pain or discomfort in the throat or chest (heartburn). Vomiting or spitting up food or liquids. Unexplained weight loss. How is this diagnosed? This condition may be diagnosed based on: Your symptoms and a physical exam. Tests, such as: Upper endoscopy. Your health care provider will insert a flexible tube with a tiny camera on it (endoscope) into your esophagus to check for a stricture. A tissue sample may also be taken to be examined under a microscope (biopsy). Esophageal pH monitoring. This test involves using a tube to collect acid in the esophagus to determine how much stomach acid is entering the esophagus. Barium swallow test. For this test, you will drink a chalky liquid (barium solution) that coats the lining of the esophagus. Then you will have an X-ray taken. The barium solution helps to  show if there is a stricture. How is this treated? Treatment for esophageal stricture depends on what is causing your condition and how severe your condition is. Treatment options include: Esophageal dilation. In this procedure, a health care provider inserts an endoscope or a tool called a dilator into the esophagus to gently stretch it and make the opening wider. Stents. In some cases, a health care provider may place a small device (stent) in the esophagus to keep it open. Acid-blocking medicines. Taking these can help you manage GERD symptoms after an esophageal stricture. Controlling your GERD symptoms or being free of them can prevent the stricture from returning. Follow these instructions at home: Eating and drinking Follow instructions from your health care provider about eating or drinking restrictions. Cut your food into small pieces, chew well, and eat slowly. Try to eat soft food that is easier to swallow. Eat and drink only when you are sitting upright. Do not drink alcohol. If you need help quitting, ask your health care provider. Do not eat during the 3 hours before bedtime. Do not overeat at meals. Do not eat foods that can make reflux worse. These include: Fatty foods, such as red meat and processed foods. Spicy foods. Soda. Tomato products. Chocolate. General instructions Take over-the-counter and prescription medicines only as told by your health care provider. Do not use any products that contain nicotine or tobacco, such as cigarettes, e-cigarettes, and chewing tobacco. If you need help quitting, ask your health care provider. Lose weight if you are overweight. Wear loose, comfortable clothing. When lying in bed, raise your head with pillows. This will help to prevent your stomach contents from backing up into your  esophagus while you sleep. Keep all follow-up visits. This is important. Contact a health care provider if: You have problems eating or swallowing. You  vomit or spit up food and liquid. Your symptoms do not improve with treatment. Get help right away if: You can no longer keep down any food, drink, or your saliva. Summary Esophageal stricture is a narrowing of the part of the body that moves food and liquid from your mouth to your stomach (esophagus). The esophagus can become narrow because of disease or damage to the area. This can make swallowing difficult, painful, or even impossible. Treatment for esophageal stricture depends on what is causing your condition and how severe your condition is. In some cases, procedures may be done to make the opening of the esophagus wider or to place a stent in the esophagus to keep it open. Do not drink alcohol, overeat at meals, or eat foods that can make reflux worse. This information is not intended to replace advice given to you by your health care provider. Make sure you discuss any questions you have with your health care provider. Document Revised: 05/16/2019 Document Reviewed: 05/16/2019 Elsevier Patient Education  [employer].  Magnesium oil spray for cramps

## 2020-11-19 NOTE — Assessment & Plan Note (Addendum)
Acute.  Start Keflex po

## 2020-11-19 NOTE — Assessment & Plan Note (Addendum)
  We discussed age appropriate health related issues, including available/recomended screening tests and vaccinations. Labs were ordered to be later reviewed . All questions were answered. We discussed one or more of the following - seat belt use, use of sunscreen/sun exposure exercise, fall risk reduction, second hand smoke exposure, firearm use and storage, seat belt use, a need for adhering to healthy diet and exercise. Labs were ordered.  All questions were answered. Colonoscopy with her gastroenterologist in [place].  Potentially she could have EGD and colonoscopy on the same day

## 2020-11-19 NOTE — Assessment & Plan Note (Addendum)
R/o peptic stricture.  Start pantoprazole GI referral-she will have to see her gastroenterologist in [place].

## 2020-11-19 NOTE — Progress Notes (Signed)
Subjective:  Patient ID: Laurie Hawkins, female    DOB: 1970-05-04  Age: 50 y.o. MRN: [id number]  CC: No chief complaint on file.   HPI Laurie Hawkins presents for a well exam C/o dysphagia a couple times when she was eating risoto  C/o joints pains, f/u on hypothyroidism  C/o R thumb stripe - new  Outpatient Medications Prior to Visit  Medication Sig Dispense Refill   albuterol (PROVENTIL HFA;VENTOLIN HFA) 108 (90 BASE) MCG/ACT inhaler Inhale into the lungs every 6 (six) hours as needed for wheezing or shortness of breath.     cetirizine (ZYRTEC) 10 MG tablet Take 1 tablet (10 mg total) by mouth daily. 90 tablet 3   Cholecalciferol (VITAMIN D3) 5000 units CAPS 1 po qd 90 capsule 3   cyclobenzaprine (FLEXERIL) 5 MG tablet Take 1-2 tablets (5-10 mg total) by mouth at bedtime. 90 tablet 2   diphenhydrAMINE (BENADRYL) 25 MG tablet Take 2 tablets (50 mg total) by mouth every 4 (four) hours as needed for allergies. 60 tablet 0   EPINEPHrine 0.3 mg/0.3 mL IJ SOAJ injection Inject 0.3 mg into the muscle as needed. 1 each 3   fluticasone (FLONASE) 50 MCG/ACT nasal spray Place 1 spray into both nostrils daily. 16 g 11   indapamide (LOZOL) 2.5 MG tablet Take 1 tablet (2.5 mg total) by mouth daily. 90 tablet 3   IRON PO Take 210 mg by mouth daily.      levothyroxine (SYNTHROID, LEVOTHROID) 100 MCG tablet Take 1 tablet (100 mcg total) by mouth daily. 90 tablet 3   Magnesium Cl-Calcium Carbonate (SLOW-MAG PO) Take 2 tablets by mouth daily.     Multiple Vitamin (MULTIVITAMIN) tablet Take 1 tablet by mouth daily. 90 tablet 3   Olopatadine HCl 0.7 % SOLN Apply to eye as needed.      potassium chloride (KLOR-CON 10) 10 MEQ tablet Take 1 tablet (10 mEq total) by mouth daily. 90 tablet 3   predniSONE (DELTASONE) 50 MG tablet Take 1 tablet (50 mg total) by mouth daily. 5 tablet 0   Probiotic CAPS Take 2 capsules by mouth daily. 180 capsule 3   Propylene Glycol (SYSTANE BALANCE OP) Apply 1 drop to eye  as needed.      pseudoephedrine (SUDAFED) 30 MG tablet Take 2 tablets (60 mg total) by mouth every 4 (four) hours as needed for congestion. 30 tablet 0   UNABLE TO FIND Med Name: Jobie Quaker 10/2,5/10     Ascorbic Acid (VITAMIN C) 1000 MG tablet Take 1,000 mg by mouth daily.     Fluticasone-Salmeterol (ADVAIR) 250-50 MCG/DOSE AEPB Inhale 1 puff into the lungs daily.     traMADol (ULTRAM) 50 MG tablet Take 1 tablet (50 mg total) by mouth every 8 (eight) hours as needed. 30 tablet 0   No facility-administered medications prior to visit.    ROS: Review of Systems  Constitutional:  Negative for activity change, appetite change, chills, fatigue and unexpected weight change.  HENT:  Negative for congestion, mouth sores and sinus pressure.   Eyes:  Negative for visual disturbance.  Respiratory:  Negative for cough and chest tightness.   Gastrointestinal:  Negative for abdominal pain and nausea.  Genitourinary:  Negative for difficulty urinating, frequency and vaginal pain.  Musculoskeletal:  Negative for back pain and gait problem.  Skin:  Negative for pallor and rash.  Neurological:  Negative for dizziness, tremors, weakness, numbness and headaches.  Psychiatric/Behavioral:  Negative for confusion and sleep disturbance.  Objective:  BP 118/70 (BP Location: Left Arm, Patient Position: Sitting, Cuff Size: Large)   Pulse 93   Temp 98.4 F (36.9 C) (Oral)   Ht 5\' 9"  (1.753 m)   Wt 242 lb (109.8 kg)   SpO2 97%   BMI 35.74 kg/m   BP Readings from Last 3 Encounters:  11/19/20 118/70  11/18/20 132/84  07/11/17 124/80    Wt Readings from Last 3 Encounters:  11/19/20 242 lb (109.8 kg)  11/18/20 243 lb (110.2 kg)  07/11/17 230 lb (104.3 kg)    Physical Exam Constitutional:      General: She is not in acute distress.    Appearance: She is well-developed. She is obese.  HENT:     Head: Normocephalic.     Right Ear: External ear normal.     Left Ear: External ear normal.     Nose:  Nose normal.  Eyes:     General:        Right eye: No discharge.        Left eye: No discharge.     Conjunctiva/sclera: Conjunctivae normal.     Pupils: Pupils are equal, round, and reactive to light.  Neck:     Thyroid: No thyromegaly.     Vascular: No JVD.     Trachea: No tracheal deviation.  Cardiovascular:     Rate and Rhythm: Normal rate and regular rhythm.     Heart sounds: Normal heart sounds.  Pulmonary:     Effort: No respiratory distress.     Breath sounds: No stridor. No wheezing.  Abdominal:     General: Bowel sounds are normal. There is no distension.     Palpations: Abdomen is soft. There is no mass.     Tenderness: There is no abdominal tenderness. There is no guarding or rebound.  Musculoskeletal:        General: No tenderness.     Cervical back: Normal range of motion and neck supple. No rigidity.  Lymphadenopathy:     Cervical: No cervical adenopathy.  Skin:    Findings: No erythema or rash.  Neurological:     Cranial Nerves: No cranial nerve deficit.     Motor: No abnormal muscle tone.     Coordination: Coordination normal.     Deep Tendon Reflexes: Reflexes normal.  Psychiatric:        Behavior: Behavior normal.        Thought Content: Thought content normal.        Judgment: Judgment normal.  Thyroid without nodules Right thumb nail with 2 mm cortical dark stripe, no skin lesions  Lab Results  Component Value Date   WBC 6.3 11/18/2020   HGB 13.0 11/18/2020   HCT 39.6 11/18/2020   PLT 232.0 11/18/2020   GLUCOSE 91 11/18/2020   CHOL 184 11/18/2020   TRIG 81.0 11/18/2020   HDL 53.50 11/18/2020   LDLCALC 114 (H) 11/18/2020   ALT 25 11/18/2020   AST 41 (H) 11/18/2020   NA 143 11/18/2020   K 3.9 11/18/2020   CL 105 11/18/2020   CREATININE 1.15 11/18/2020   BUN 12 11/18/2020   CO2 30 11/18/2020   TSH 2.55 11/18/2020    DG Chest 2 View  Result Date: 07/08/2017 CLINICAL DATA:  50 year old with mild intermittent asthma without complication.  Cough and hemoptysis. EXAM: CHEST - 2 VIEW COMPARISON:  07/09/2014 FINDINGS: The heart size and mediastinal contours are within normal limits. Both lungs are clear. The visualized skeletal structures are  unremarkable. IMPRESSION: Normal chest examination. Electronically Signed   By: Markus Daft M.D.   On: 07/08/2017 13:13    Assessment & Plan:   Problem List Items Addressed This Visit     Cystitis    Acute.  Start Keflex po      Dysphagia    R/o peptic stricture.  Start pantoprazole GI referral-she will have to see her gastroenterologist in [place].      Hypothyroid    Obtain lab work including TSH, free T4.  The patient will follow up with her endocrinologist in [place]      Well adult exam     We discussed age appropriate health related issues, including available/recomended screening tests and vaccinations. Labs were ordered to be later reviewed . All questions were answered. We discussed one or more of the following - seat belt use, use of sunscreen/sun exposure exercise, fall risk reduction, second hand smoke exposure, firearm use and storage, seat belt use, a need for adhering to healthy diet and exercise. Labs were ordered.  All questions were answered.           Meds ordered this encounter  Medications   pantoprazole (PROTONIX) 40 MG tablet    Sig: Take 1 tablet (40 mg total) by mouth daily.    Dispense:  90 tablet    Refill:  3   cephALEXin (KEFLEX) 500 MG capsule    Sig: Take 1 capsule (500 mg total) by mouth 4 (four) times daily.    Dispense:  16 capsule    Refill:  0      Follow-up: Return in about 3 months (around 02/19/2021).  Walker Kehr, MD

## 2020-11-20 ENCOUNTER — Ambulatory Visit
Admission: RE | Admit: 2020-11-20 | Discharge: 2020-11-20 | Disposition: A | Discharge status: Home / Self Care | Payer: 59 | Source: Ambulatory Visit

## 2020-11-20 ENCOUNTER — Other Ambulatory Visit: Payer: Self-pay

## 2020-11-20 DIAGNOSIS — Z1231 Encounter for screening mammogram for malignant neoplasm of breast: Secondary | ICD-10-CM

## 2020-11-24 NOTE — Assessment & Plan Note (Signed)
Obtain lab work including TSH, free T4.  The patient will follow up with her endocrinologist in [place]

## 2021-07-29 NOTE — Progress Notes (Signed)
Laurie Hawkins [address] [place] Phone: [phone] Subjective:   [name], am serving as a scribe for Dr. Hulan Saas.  I'm seeing this patient by the request  of:  Plotnikov, Evie Lacks, MD  CC: right hand pain   IRJ:[id number]  Laurie Hawkins is a 51 y.o. female coming in with complaint of R hand pain. Last seen in November 2022 for leg pain. Patient states knee are okay. Hurts to use right thumb. Pain around the [facility] joint and down the thumb.      Past Medical History:  Diagnosis Date   Hypertension    Past Surgical History:  Procedure Laterality Date   CESAREAN SECTION  2009   THYROIDECTOMY  2011   Social History   Socioeconomic History   Marital status: Married    Spouse name: Not on file   Number of children: Not on file   Years of education: Not on file   Highest education level: Not on file  Occupational History   Not on file  Tobacco Use   Smoking status: Never   Smokeless tobacco: Never  Vaping Use   Vaping Use: Never used  Substance and Sexual Activity   Alcohol use: Yes    Alcohol/week: 0.0 standard drinks of alcohol   Drug use: No   Sexual activity: Yes    Birth control/protection: Condom    Comment: DECLINED INSURANCE QUESTIONS  Other Topics Concern   Not on file  Social History Narrative   Not on file   Social Determinants of Health   Financial Resource Strain: Not on file  Food Insecurity: Not on file  Transportation Needs: Not on file  Physical Activity: Not on file  Stress: Not on file  Social Connections: Not on file   Allergies  Allergen Reactions   Bee Venom Anaphylaxis   Ace Inhibitors     Angioedema    Family History  Problem Relation Age of Onset   Arthritis Other    Alcohol abuse Other    Cancer Other        Breast and Prostate   Kidney disease Other    Mental illness Other    Diabetes Other    Sarcoidosis Other    Rheum arthritis Other     Current  Outpatient Medications (Endocrine & Metabolic):    levothyroxine (SYNTHROID, LEVOTHROID) 100 MCG tablet, Take 1 tablet (100 mcg total) by mouth daily.   predniSONE (DELTASONE) 50 MG tablet, Take 1 tablet (50 mg total) by mouth daily.  Current Outpatient Medications (Cardiovascular):    EPINEPHrine 0.3 mg/0.3 mL IJ SOAJ injection, Inject 0.3 mg into the muscle as needed.   indapamide (LOZOL) 2.5 MG tablet, Take 1 tablet (2.5 mg total) by mouth daily.  Current Outpatient Medications (Respiratory):    albuterol (PROVENTIL HFA;VENTOLIN HFA) 108 (90 BASE) MCG/ACT inhaler, Inhale into the lungs every 6 (six) hours as needed for wheezing or shortness of breath.   cetirizine (ZYRTEC) 10 MG tablet, Take 1 tablet (10 mg total) by mouth daily.   diphenhydrAMINE (BENADRYL) 25 MG tablet, Take 2 tablets (50 mg total) by mouth every 4 (four) hours as needed for allergies.   fluticasone (FLONASE) 50 MCG/ACT nasal spray, Place 1 spray into both nostrils daily.   pseudoephedrine (SUDAFED) 30 MG tablet, Take 2 tablets (60 mg total) by mouth every 4 (four) hours as needed for congestion.  Current Outpatient Medications (Analgesics):    meloxicam (MOBIC) 15 MG tablet,  Take 1 tablet (15 mg total) by mouth daily.  Current Outpatient Medications (Hematological):    IRON PO, Take 210 mg by mouth daily.   Current Outpatient Medications (Other):    cephALEXin (KEFLEX) 500 MG capsule, Take 1 capsule (500 mg total) by mouth 4 (four) times daily.   Cholecalciferol (VITAMIN D3) 5000 units CAPS, 1 po qd   cyclobenzaprine (FLEXERIL) 5 MG tablet, Take 1-2 tablets (5-10 mg total) by mouth at bedtime.   Magnesium Cl-Calcium Carbonate (SLOW-MAG PO), Take 2 tablets by mouth daily.   Multiple Vitamin (MULTIVITAMIN) tablet, Take 1 tablet by mouth daily.   Olopatadine HCl 0.7 % SOLN, Apply to eye as needed.    pantoprazole (PROTONIX) 40 MG tablet, Take 1 tablet (40 mg total) by mouth daily.   potassium chloride (KLOR-CON 10) 10  MEQ tablet, Take 1 tablet (10 mEq total) by mouth daily.   Probiotic CAPS, Take 2 capsules by mouth daily.   Propylene Glycol (SYSTANE BALANCE OP), Apply 1 drop to eye as needed.    UNABLE TO FIND, Med Name: Jobie Quaker 10/2,5/10   Reviewed prior external information including notes and imaging from  primary care provider As well as notes that were available from care everywhere and other healthcare systems.  Past medical history, social, surgical and family history all reviewed in electronic medical record.  No pertanent information unless stated regarding to the chief complaint.   Review of Systems:  No headache, visual changes, nausea, vomiting, diarrhea, constipation, dizziness, abdominal pain, skin rash, fevers, chills, night sweats, weight loss, swollen lymph nodes, body aches, joint swelling, chest pain, shortness of breath, mood changes. POSITIVE muscle aches  Objective  Blood pressure 122/84, pulse 84, height '5\' 9"'$  (1.753 m), weight 251 lb (113.9 kg), SpO2 97 %.   General: No apparent distress alert and oriented x3 mood and affect normal, dressed appropriately.  HEENT: Pupils equal, extraocular movements intact  Respiratory: Patient's speak in full sentences and does not appear short of breath  Cardiovascular: No lower extremity edema, non tender, no erythema   Right hand exam shows the patient is very minorly tender over the [facility] joint and the IP joint.  Patient does have good range of motion.  Positive grind test on the No laxity of the UCL  Limited muscular skeletal ultrasound was performed and interpreted by Hulan Saas, M  Limited ultrasound of patient's abdomen shows no significant arthritic changes but patient may have an accessory sesamoid bone noted just inferior to the flexor tendon.  Patient does have some hypoechoic changes with a small effusion noted of the IP joint of the thumb. Impression: Nonspecific changes of the thumb    Impression and Recommendations:     The above documentation has been reviewed and is accurate and complete Lyndal Pulley, DO

## 2021-07-30 ENCOUNTER — Other Ambulatory Visit: Payer: Self-pay

## 2021-07-30 ENCOUNTER — Encounter: Payer: Self-pay | Admitting: Family Medicine

## 2021-07-30 ENCOUNTER — Ambulatory Visit: Payer: Self-pay

## 2021-07-30 ENCOUNTER — Ambulatory Visit (INDEPENDENT_AMBULATORY_CARE_PROVIDER_SITE_OTHER): Payer: 59 | Admitting: Family Medicine

## 2021-07-30 VITALS — BP 122/84 | HR 84 | Ht 69.0 in | Wt 251.0 lb

## 2021-07-30 DIAGNOSIS — M79644 Pain in right finger(s): Secondary | ICD-10-CM | POA: Diagnosis not present

## 2021-07-30 DIAGNOSIS — G8929 Other chronic pain: Secondary | ICD-10-CM | POA: Diagnosis not present

## 2021-07-30 DIAGNOSIS — M79641 Pain in right hand: Secondary | ICD-10-CM

## 2021-07-30 MED ORDER — MELOXICAM 15 MG PO TABS: 15.0000 mg | ORAL_TABLET | ORAL | 0 refills | Status: DC | PRN

## 2021-07-30 MED ORDER — MELOXICAM 15 MG PO TABS: 15.0000 mg | ORAL_TABLET | Freq: Every day | ORAL | 0 refills | Status: DC

## 2021-07-30 NOTE — Patient Instructions (Addendum)
Wear thumb spica brace for 2 weeks and nightly for 2 weeks Meloxicam '15mg'$  prescribed Send update in 2-3 weeks

## 2021-07-30 NOTE — Assessment & Plan Note (Signed)
Patient may have an exacerbation.  Volume noted.  Discussed the possibility of possible bracing initially.  Given some mild responses.  Discussed the possibility of home exercises otherwise.  Worsening.  Would like to consider the x-rays.  We will follow-up with me again in 6 weeks

## 2021-07-31 ENCOUNTER — Ambulatory Visit (INDEPENDENT_AMBULATORY_CARE_PROVIDER_SITE_OTHER): Payer: 59 | Admitting: Radiology

## 2021-07-31 ENCOUNTER — Encounter: Payer: Self-pay | Admitting: Radiology

## 2021-07-31 ENCOUNTER — Other Ambulatory Visit (HOSPITAL_COMMUNITY)
Admission: RE | Admit: 2021-07-31 | Discharge: 2021-07-31 | Disposition: A | Discharge status: Home / Self Care | Payer: 59 | Source: Ambulatory Visit | Attending: Radiology | Admitting: Radiology

## 2021-07-31 VITALS — BP 120/78 | HR 72 | Resp 16 | Ht 66.75 in | Wt 248.0 lb

## 2021-07-31 DIAGNOSIS — R350 Frequency of micturition: Secondary | ICD-10-CM | POA: Diagnosis not present

## 2021-07-31 DIAGNOSIS — N952 Postmenopausal atrophic vaginitis: Secondary | ICD-10-CM | POA: Diagnosis not present

## 2021-07-31 DIAGNOSIS — Z01419 Encounter for gynecological examination (general) (routine) without abnormal findings: Secondary | ICD-10-CM

## 2021-07-31 LAB — NO CULTURE INDICATED

## 2021-07-31 LAB — URINALYSIS, COMPLETE W/RFL CULTURE
Bacteria, UA: NONE SEEN /HPF
Bilirubin Urine: NEGATIVE
Casts: NONE SEEN /LPF
Crystals: NONE SEEN /HPF
Glucose, UA: NEGATIVE
Hyaline Cast: NONE SEEN /LPF
Leukocyte Esterase: NEGATIVE
Nitrites, Initial: NEGATIVE
RBC / HPF: NONE SEEN /HPF (ref 0–2)
Specific Gravity, Urine: 1.025 (ref 1.001–1.035)
WBC, UA: NONE SEEN /HPF (ref 0–5)
Yeast: NONE SEEN /HPF
pH: 5.5 (ref 5.0–8.0)

## 2021-07-31 MED ORDER — ESTRADIOL 0.1 MG/GM VA CREA: 1.0000 g | TOPICAL_CREAM | VAGINAL | 12 refills | Status: AC

## 2021-07-31 NOTE — Progress Notes (Signed)
   Laurie Hawkins 10/16/1970 [id number]   History: Postmenopausal 51 y.o. presents for annual exam. Here visiting from [place], has a PCP and gyn there. Hx of thyroid cancer, doing well now. Does report some urinary urgency. Had an episode of vaginal bleeding after using a tampon for d/c. None since.    Gynecologic History Postmenopausal Last Pap: 2021. Results were: normal Last mammogram: 2022. Results were: normal   Obstetric History OB History  Gravida Para Term Preterm AB Living  '1 1 1 '$ 0 0 1  SAB IAB Ectopic Multiple Live Births  0 0 0 0 0    # Outcome Date GA Lbr Len/2nd Weight Sex Delivery Anes PTL Lv  1 Term              The following portions of the patient's history were reviewed and updated as appropriate: allergies, current medications, past family history, past medical history, past social history, past surgical history, and problem list.  Review of Systems Pertinent items noted in HPI and remainder of comprehensive ROS otherwise negative.  Past medical history, past surgical history, family history and social history were all reviewed and documented in the EPIC chart.  Exam:  Vitals:   07/31/21 0940  BP: 120/78  Pulse: 72  Resp: 16  Weight: 248 lb (112.5 kg)  Height: 5' 6.75" (1.695 m)   Body mass index is 39.13 kg/m.  General appearance:  Normal, obese Thyroid:  Symmetrical, normal in size, without palpable masses or nodularity. Respiratory  Auscultation:  Clear without wheezing or rhonchi Cardiovascular  Auscultation:  Regular rate, without rubs, murmurs or gallops  Edema/varicosities:  Not grossly evident Abdominal  Soft,nontender, without masses, guarding or rebound.  Liver/spleen:  No organomegaly noted  Hernia:  None appreciated  Skin  Inspection:  Grossly normal Breasts: Examined lying and sitting.   Right: Without masses, retractions, nipple discharge or axillary adenopathy.   Left: Without masses, retractions, nipple discharge or axillary  adenopathy. Genitourinary   Inguinal/mons:  Normal without inguinal adenopathy  External genitalia:  Normal appearing vulva with no masses, tenderness, or lesions  BUS/Urethra/Skene's glands:  Normal  Vagina:  Normal appearing with normal color and discharge, no lesions. Atrophy: moderate   Cervix:  Normal appearing without discharge or lesions  Uterus:  Normal in size, shape and contour.  Midline and mobile, nontender  Adnexa/parametria:     Rt: Normal in size, without masses or tenderness.   Lt: Normal in size, without masses or tenderness.  Anus and perineum: Normal    Patient informed chaperone available to be present for breast and pelvic exam. Patient has requested no chaperone to be present. Patient has been advised what will be completed during breast and pelvic exam.   Assessment/Plan:   1. Well woman exam with routine gynecological exam - Mammo in [place], has yearly pelvic u/s as well - Cytology - PAP( [facility])  2. Urinary frequency  - Urinalysis,Complete w/RFL Culture  3. Vaginal atrophy  - estradiol (ESTRACE VAGINAL) 0.1 MG/GM vaginal cream; Place 1 g vaginally 3 (three) times a week.  Dispense: 42.5 g; Refill: 12     Discussed SBE, colonoscopy and DEXA screening as directed. Recommend 142mns of exercise weekly, including weight bearing exercise. Encouraged the use of seatbelts and sunscreen.  Return in 1 year for annual or sooner prn.  CKerry DoryWHNP-BC, 10:36 AM 07/31/2021

## 2021-08-05 LAB — CYTOLOGY - PAP
Comment: NEGATIVE
High risk HPV: NEGATIVE

## 2021-08-19 ENCOUNTER — Ambulatory Visit (INDEPENDENT_AMBULATORY_CARE_PROVIDER_SITE_OTHER): Payer: 59 | Admitting: Internal Medicine

## 2021-08-19 ENCOUNTER — Encounter: Payer: Self-pay | Admitting: Internal Medicine

## 2021-08-19 VITALS — BP 120/68 | HR 80 | Temp 98.0°F | Ht 66.75 in | Wt 253.8 lb

## 2021-08-19 DIAGNOSIS — Z1211 Encounter for screening for malignant neoplasm of colon: Secondary | ICD-10-CM

## 2021-08-19 DIAGNOSIS — K219 Gastro-esophageal reflux disease without esophagitis: Secondary | ICD-10-CM | POA: Diagnosis not present

## 2021-08-19 DIAGNOSIS — Z0000 Encounter for general adult medical examination without abnormal findings: Secondary | ICD-10-CM | POA: Diagnosis not present

## 2021-08-19 DIAGNOSIS — R0609 Other forms of dyspnea: Secondary | ICD-10-CM | POA: Diagnosis not present

## 2021-08-19 DIAGNOSIS — E890 Postprocedural hypothyroidism: Secondary | ICD-10-CM

## 2021-08-19 MED ORDER — FAMOTIDINE 40 MG PO TABS: 40.0000 mg | ORAL_TABLET | Freq: Every day | ORAL | 3 refills | Status: AC

## 2021-08-19 NOTE — Patient Instructions (Signed)
Cardiac CT calcium scoring test $99    Computed tomography, more commonly known as a CT or CAT scan, is a diagnostic medical imaging test. Like traditional x-rays, it produces multiple images or pictures of the inside of the body. The cross-sectional images generated during a CT scan can be reformatted in multiple planes. They can even generate three-dimensional images. These images can be viewed on a computer monitor, printed on film or by a 3D printer, or transferred to a CD or DVD. CT images of internal organs, bones, soft tissue and blood vessels provide greater detail than traditional x-rays, particularly of soft tissues and blood vessels. A cardiac CT scan for coronary calcium is a non-invasive way of obtaining information about the presence, location and extent of calcified plaque in the coronary arteries--the vessels that supply oxygen-containing blood to the heart muscle. Calcified plaque results when there is a build-up of fat and other substances under the inner layer of the artery. This material can calcify which signals the presence of atherosclerosis, a disease of the vessel wall, also called coronary artery disease (CAD). People with this disease have an increased risk for heart attacks. In addition, over time, progression of plaque build up (CAD) can narrow the arteries or even close off blood flow to the heart. The result may be chest pain, sometimes called "angina," or a heart attack. Because calcium is a marker of CAD, the amount of calcium detected on a cardiac CT scan is a helpful prognostic tool. The findings on cardiac CT are expressed as a calcium score. Another name for this test is coronary artery calcium scoring.  What are some common uses of the procedure? The goal of cardiac CT scan for calcium scoring is to determine if CAD is present and to what extent, even if there are no symptoms. It is a screening study that may be recommended by a physician for patients with risk  factors for CAD but no clinical symptoms. The major risk factors for CAD are: high blood cholesterol levels  family history of heart attacks  diabetes  high blood pressure  cigarette smoking  overweight or obese  physical inactivity   A negative cardiac CT scan for calcium scoring shows no calcification within the coronary arteries. This suggests that CAD is absent or so minimal it cannot be seen by this technique. The chance of having a heart attack over the next two to five years is very low under these circumstances. A positive test means that CAD is present, regardless of whether or not the patient is experiencing any symptoms. The amount of calcification--expressed as the calcium score--may help to predict the likelihood of a myocardial infarction (heart attack) in the coming years and helps your medical doctor or cardiologist decide whether the patient may need to take preventive medicine or undertake other measures such as diet and exercise to lower the risk for heart attack. The extent of CAD is graded according to your calcium score:  Calcium Score  Presence of CAD (coronary artery disease)  0 No evidence of CAD   1-10 Minimal evidence of CAD  11-100 Mild evidence of CAD  101-400 Moderate evidence of CAD  Over 400 Extensive evidence of CAD   Coronary artery calcium (CAC) score is a strong predictor of incident coronary heart disease (CHD) and provides predictive information beyond traditional risk factors. CAC scoring is reasonable to use in the decision to withhold, postpone, or initiate statin therapy in intermediate-risk or selected borderline-risk asymptomatic adults (age 38-75  years and LDL-C >=70 to <190 mg/dL) who do not have diabetes or established atherosclerotic cardiovascular disease (ASCVD).* In intermediate-risk (10-year ASCVD risk >=7.5% to <20%) adults or selected borderline-risk (10-year ASCVD risk >=5% to <7.5%) adults in whom a CAC score is measured for the  purpose of making a treatment decision the following recommendations have been made:   If CAC=0, it is reasonable to withhold statin therapy and reassess in 5 to 10 years, as long as higher risk conditions are absent (diabetes mellitus, family history of premature CHD in first degree relatives (males <55 years; females <65 years), cigarette smoking, or LDL >=190 mg/dL).   If CAC is 1 to 99, it is reasonable to initiate statin therapy for patients >=65 years of age.   If CAC is >=100 or >=75th percentile, it is reasonable to initiate statin therapy at any age.   Cardiology referral should be considered for patients with CAC scores >=400 or >=75th percentile.   *2018 AHA/ACC/AACVPR/AAPA/ABC/ACPM/ADA/AGS/APhA/ASPC/NLA/PCNA Guideline on the Management of Blood Cholesterol: A Report of the American College of Cardiology/American Heart Association Task Force on Clinical Practice Guidelines. J Am Coll Cardiol. 2019;73(24):3168-3209.

## 2021-08-19 NOTE — Progress Notes (Signed)
Subjective:  Patient ID: Laurie Hawkins, female    DOB: [birth year]  Age: 51 y.o. MRN: [id number]  CC: Follow-up (No concerns. )   HPI Laurie Hawkins presents for a well exam Pt saw GI doctor for GERD in [place] - better F/u  hypothyroidism   Outpatient Medications Prior to Visit  Medication Sig Dispense Refill   albuterol (PROVENTIL HFA;VENTOLIN HFA) 108 (90 BASE) MCG/ACT inhaler Inhale into the lungs every 6 (six) hours as needed for wheezing or shortness of breath.     cetirizine (ZYRTEC) 10 MG tablet Take 1 tablet (10 mg total) by mouth daily. 90 tablet 3   Cholecalciferol (VITAMIN D3) 5000 units CAPS 1 po qd 90 capsule 3   diphenhydrAMINE (BENADRYL) 25 MG tablet Take 2 tablets (50 mg total) by mouth every 4 (four) hours as needed for allergies. 60 tablet 0   EPINEPHrine 0.3 mg/0.3 mL IJ SOAJ injection Inject 0.3 mg into the muscle as needed. 1 each 3   estradiol (ESTRACE VAGINAL) 0.1 MG/GM vaginal cream Place 1 g vaginally 3 (three) times a week. 42.5 g 12   fluticasone (FLONASE) 50 MCG/ACT nasal spray Place 1 spray into both nostrils daily. 16 g 11   levothyroxine (SYNTHROID, LEVOTHROID) 100 MCG tablet Take 1 tablet (100 mcg total) by mouth daily. (Patient taking differently: Take 100 mcg by mouth. Monday thru friday) 90 tablet 3   Levothyroxine Sodium (EUTHYROX PO) Take 125 mg by mouth. On the weekends     MAGNESIUM PO Take by mouth as needed. + melatonin     metFORMIN (GLUCOPHAGE-XR) 500 MG 24 hr tablet Take 500 mg by mouth daily with breakfast.     pseudoephedrine (SUDAFED) 30 MG tablet Take 2 tablets (60 mg total) by mouth every 4 (four) hours as needed for congestion. 30 tablet 0   UNABLE TO FIND Med Name: Tripliam 10/2,5/10     meloxicam (MOBIC) 15 MG tablet Take 1 tablet (15 mg total) by mouth daily. (Patient not taking: Reported on 08/19/2021) 30 tablet 0   No facility-administered medications prior to visit.    ROS: Review of Systems  Constitutional:  Negative for  activity change, appetite change, chills, fatigue and unexpected weight change.  HENT:  Negative for congestion, mouth sores and sinus pressure.   Eyes:  Negative for visual disturbance.  Respiratory:  Negative for cough and chest tightness.   Gastrointestinal:  Negative for abdominal pain and nausea.  Genitourinary:  Negative for difficulty urinating, frequency and vaginal pain.  Musculoskeletal:  Negative for back pain and gait problem.  Skin:  Negative for pallor and rash.  Neurological:  Negative for dizziness, tremors, weakness, numbness and headaches.  Psychiatric/Behavioral:  Negative for confusion, sleep disturbance and suicidal ideas.     Objective:  BP 120/68   Pulse 80   Temp 98 F (36.7 C) (Oral)   Ht 5' 6.75" (1.695 m)   Wt 253 lb 12.8 oz (115.1 kg)   LMP 06/29/2017 (Approximate)   SpO2 96%   BMI 40.05 kg/m   BP Readings from Last 3 Encounters:  08/19/21 120/68  07/31/21 120/78  07/30/21 122/84    Wt Readings from Last 3 Encounters:  08/19/21 253 lb 12.8 oz (115.1 kg)  07/31/21 248 lb (112.5 kg)  07/30/21 251 lb (113.9 kg)    Physical Exam Constitutional:      General: She is not in acute distress.    Appearance: She is well-developed.  HENT:     Head: Normocephalic.  Right Ear: External ear normal.     Left Ear: External ear normal.     Nose: Nose normal.  Eyes:     General:        Right eye: No discharge.        Left eye: No discharge.     Conjunctiva/sclera: Conjunctivae normal.     Pupils: Pupils are equal, round, and reactive to light.  Neck:     Thyroid: No thyromegaly.     Vascular: No JVD.     Trachea: No tracheal deviation.  Cardiovascular:     Rate and Rhythm: Normal rate and regular rhythm.     Heart sounds: Normal heart sounds.  Pulmonary:     Effort: No respiratory distress.     Breath sounds: No stridor. No wheezing.  Abdominal:     General: Bowel sounds are normal. There is no distension.     Palpations: Abdomen is soft.  There is no mass.     Tenderness: There is no abdominal tenderness. There is no guarding or rebound.  Musculoskeletal:        General: No tenderness.     Cervical back: Normal range of motion and neck supple. No rigidity.  Lymphadenopathy:     Cervical: No cervical adenopathy.  Skin:    Findings: No erythema or rash.  Neurological:     Cranial Nerves: No cranial nerve deficit.     Motor: No abnormal muscle tone.     Coordination: Coordination normal.     Deep Tendon Reflexes: Reflexes normal.  Psychiatric:        Behavior: Behavior normal.        Thought Content: Thought content normal.        Judgment: Judgment normal.     Lab Results  Component Value Date   WBC 6.3 11/18/2020   HGB 13.0 11/18/2020   HCT 39.6 11/18/2020   PLT 232.0 11/18/2020   GLUCOSE 91 11/18/2020   CHOL 184 11/18/2020   TRIG 81.0 11/18/2020   HDL 53.50 11/18/2020   LDLCALC 114 (H) 11/18/2020   ALT 25 11/18/2020   AST 41 (H) 11/18/2020   NA 143 11/18/2020   K 3.9 11/18/2020   CL 105 11/18/2020   CREATININE 1.15 11/18/2020   BUN 12 11/18/2020   CO2 30 11/18/2020   TSH 2.55 11/18/2020    No results found.  Assessment & Plan:   Problem List Items Addressed This Visit     GERD (gastroesophageal reflux disease)    Prescribed Pepcid Follow-up day Danielle's gastroenterologist in [place]      Relevant Medications   famotidine (PEPCID) 40 MG tablet   Hypothyroid    On Levothyroxine    The patient will follow up with her endocrinologist in [place]      Well adult exam - Primary    We discussed age appropriate health related issues, including available/recomended screening tests and vaccinations. Labs were ordered to be later reviewed . All questions were answered. We discussed one or more of the following - seat belt use, use of sunscreen/sun exposure exercise, fall risk reduction, second hand smoke exposure, firearm use and storage, seat belt use, a need for adhering to healthy diet and  exercise. Labs were ordered.  All questions were answered. Colonoscopy with her gastroenterologist in [place].  Potentially she could have EGD and colonoscopy on the same day      Relevant Orders   TSH   Urinalysis   CBC with Differential/Platelet   Lipid panel  Comprehensive metabolic panel   Other Visit Diagnoses     Colon cancer screening       Relevant Orders   Cologuard   Dyspnea on exertion       Relevant Orders   CT CARDIAC SCORING (SELF PAY ONLY)         Meds ordered this encounter  Medications   famotidine (PEPCID) 40 MG tablet    Sig: Take 1 tablet (40 mg total) by mouth daily.    Dispense:  90 tablet    Refill:  3      Follow-up: Return in about 1 year (around 08/20/2022) for a follow-up visit.  Walker Kehr, MD

## 2021-08-22 DIAGNOSIS — K219 Gastro-esophageal reflux disease without esophagitis: Secondary | ICD-10-CM | POA: Insufficient documentation

## 2021-08-22 NOTE — Assessment & Plan Note (Signed)
We discussed age appropriate health related issues, including available/recomended screening tests and vaccinations. Labs were ordered to be later reviewed . All questions were answered. We discussed one or more of the following - seat belt use, use of sunscreen/sun exposure exercise, fall risk reduction, second hand smoke exposure, firearm use and storage, seat belt use, a need for adhering to healthy diet and exercise. Labs were ordered.  All questions were answered. Colonoscopy with her gastroenterologist in [place].  Potentially she could have EGD and colonoscopy on the same day

## 2021-08-22 NOTE — Assessment & Plan Note (Signed)
On Levothyroxine    The patient will follow up with her endocrinologist in [place]

## 2021-08-22 NOTE — Assessment & Plan Note (Signed)
Prescribed Pepcid Follow-up day [facility]le's gastroenterologist in [place]

## 2021-08-24 ENCOUNTER — Other Ambulatory Visit (INDEPENDENT_AMBULATORY_CARE_PROVIDER_SITE_OTHER): Payer: 59

## 2021-08-24 DIAGNOSIS — Z0000 Encounter for general adult medical examination without abnormal findings: Secondary | ICD-10-CM | POA: Diagnosis not present

## 2021-08-24 LAB — COMPREHENSIVE METABOLIC PANEL
ALT: 34 U/L (ref 0–35)
AST: 29 U/L (ref 0–37)
Albumin: 4 g/dL (ref 3.5–5.2)
Alkaline Phosphatase: 53 U/L (ref 39–117)
BUN: 13 mg/dL (ref 6–23)
CO2: 29 mEq/L (ref 19–32)
Calcium: 8.9 mg/dL (ref 8.4–10.5)
Chloride: 103 mEq/L (ref 96–112)
Creatinine, Ser: 1.1 mg/dL (ref 0.40–1.20)
GFR: 58.12 mL/min — ABNORMAL LOW (ref >60.00)
Glucose, Bld: 93 mg/dL (ref 70–99)
Potassium: 3.4 mEq/L — ABNORMAL LOW (ref 3.5–5.1)
Sodium: 140 mEq/L (ref 135–145)
Total Bilirubin: 0.4 mg/dL (ref 0.2–1.2)
Total Protein: 6.9 g/dL (ref 6.0–8.3)

## 2021-08-24 LAB — LIPID PANEL
Cholesterol: 188 mg/dL (ref 0–200)
HDL: 46.8 mg/dL (ref >39.00)
LDL Cholesterol: 104 mg/dL — ABNORMAL HIGH (ref 0–99)
NonHDL: 141.62
Total CHOL/HDL Ratio: 4
Triglycerides: 187 mg/dL — ABNORMAL HIGH (ref 0.0–149.0)
VLDL: 37.4 mg/dL (ref 0.0–40.0)

## 2021-08-24 LAB — CBC WITH DIFFERENTIAL/PLATELET
Basophils Absolute: 0.1 10*3/uL (ref 0.0–0.1)
Basophils Relative: 0.8 % (ref 0.0–3.0)
Eosinophils Absolute: 0.2 10*3/uL (ref 0.0–0.7)
Eosinophils Relative: 2.6 % (ref 0.0–5.0)
HCT: 39.6 % (ref 36.0–46.0)
Hemoglobin: 13 g/dL (ref 12.0–15.0)
Lymphocytes Relative: 46.1 % — ABNORMAL HIGH (ref 12.0–46.0)
Lymphs Abs: 2.7 10*3/uL (ref 0.7–4.0)
MCHC: 32.8 g/dL (ref 30.0–36.0)
MCV: 87.4 fl (ref 78.0–100.0)
Monocytes Absolute: 0.5 10*3/uL (ref 0.1–1.0)
Monocytes Relative: 8.4 % (ref 3.0–12.0)
Neutro Abs: 2.5 10*3/uL (ref 1.4–7.7)
Neutrophils Relative %: 42.1 % — ABNORMAL LOW (ref 43.0–77.0)
Platelets: 221 10*3/uL (ref 150.0–400.0)
RBC: 4.53 Mil/uL (ref 3.87–5.11)
RDW: 14.5 % (ref 11.5–15.5)
WBC: 5.9 10*3/uL (ref 4.0–10.5)

## 2021-08-24 LAB — URINALYSIS
Bilirubin Urine: NEGATIVE
Ketones, ur: NEGATIVE
Leukocytes,Ua: NEGATIVE
Nitrite: NEGATIVE
Specific Gravity, Urine: 1.015 (ref 1.000–1.030)
Total Protein, Urine: NEGATIVE
Urine Glucose: NEGATIVE
Urobilinogen, UA: 0.2 (ref 0.0–1.0)
pH: 7 (ref 5.0–8.0)

## 2021-08-24 LAB — TSH: TSH: 15.23 u[IU]/mL — ABNORMAL HIGH (ref 0.35–5.50)

## 2021-08-25 ENCOUNTER — Other Ambulatory Visit (INDEPENDENT_AMBULATORY_CARE_PROVIDER_SITE_OTHER): Payer: 59

## 2021-08-25 ENCOUNTER — Ambulatory Visit (HOSPITAL_COMMUNITY)
Admission: RE | Admit: 2021-08-25 | Discharge: 2021-08-25 | Disposition: A | Discharge status: Home / Self Care | Payer: 59 | Source: Ambulatory Visit | Attending: Internal Medicine | Admitting: Internal Medicine

## 2021-08-25 ENCOUNTER — Encounter (HOSPITAL_COMMUNITY): Payer: Self-pay

## 2021-08-25 DIAGNOSIS — E039 Hypothyroidism, unspecified: Secondary | ICD-10-CM | POA: Diagnosis not present

## 2021-08-25 DIAGNOSIS — R0609 Other forms of dyspnea: Secondary | ICD-10-CM

## 2021-08-25 LAB — T4, FREE: Free T4: 0.81 ng/dL (ref 0.60–1.60)

## 2021-08-25 LAB — T3, FREE: T3, Free: 2.6 pg/mL (ref 2.3–4.2)

## 2021-08-26 ENCOUNTER — Ambulatory Visit (HOSPITAL_COMMUNITY)
Admission: RE | Admit: 2021-08-26 | Discharge: 2021-08-26 | Disposition: A | Discharge status: Home / Self Care | Payer: 59 | Source: Ambulatory Visit | Attending: Internal Medicine | Admitting: Internal Medicine

## 2021-08-26 DIAGNOSIS — R0609 Other forms of dyspnea: Secondary | ICD-10-CM | POA: Insufficient documentation

## 2021-09-04 ENCOUNTER — Ambulatory Visit (HOSPITAL_BASED_OUTPATIENT_CLINIC_OR_DEPARTMENT_OTHER): Payer: 59

## 2022-02-08 ENCOUNTER — Encounter: Payer: Self-pay | Admitting: Internal Medicine

## 2022-02-08 ENCOUNTER — Encounter: Payer: Self-pay | Admitting: Family Medicine

## 2022-02-16 ENCOUNTER — Encounter: Payer: Self-pay | Admitting: Internal Medicine

## 2022-02-16 ENCOUNTER — Ambulatory Visit: Payer: 59 | Admitting: Internal Medicine

## 2022-02-16 VITALS — BP 118/80 | HR 90 | Temp 98.7°F | Ht 66.75 in | Wt 250.0 lb

## 2022-02-16 DIAGNOSIS — Z91030 Bee allergy status: Secondary | ICD-10-CM

## 2022-02-16 DIAGNOSIS — R5383 Other fatigue: Secondary | ICD-10-CM

## 2022-02-16 DIAGNOSIS — E890 Postprocedural hypothyroidism: Secondary | ICD-10-CM

## 2022-02-16 DIAGNOSIS — I10 Essential (primary) hypertension: Secondary | ICD-10-CM

## 2022-02-16 DIAGNOSIS — M2550 Pain in unspecified joint: Secondary | ICD-10-CM | POA: Diagnosis not present

## 2022-02-16 DIAGNOSIS — E559 Vitamin D deficiency, unspecified: Secondary | ICD-10-CM

## 2022-02-16 MED ORDER — EPINEPHRINE 0.3 MG/0.3ML IJ SOAJ
0.3000 mg | INTRAMUSCULAR | 3 refills | Status: DC | PRN
Start: 2022-02-16 — End: 2023-07-14

## 2022-02-16 NOTE — Assessment & Plan Note (Signed)
Check labs 

## 2022-02-16 NOTE — Assessment & Plan Note (Addendum)
BP Readings from Last 3 Encounters:  02/16/22 118/80  08/19/21 120/68  07/31/21 120/78   Will continue to monitor blood pressure.  Currently off blood pressure meds

## 2022-02-16 NOTE — Progress Notes (Signed)
Subjective:  Patient ID: Laurie Hawkins, female    DOB: September 08, 1970  Age: 52 y.o. MRN: [id number]  CC: Fatigue   HPI [name] presents for fatigue  C/o R thumb pain  C/o arthralgias  Outpatient Medications Prior to Visit  Medication Sig Dispense Refill   albuterol (PROVENTIL HFA;VENTOLIN HFA) 108 (90 BASE) MCG/ACT inhaler Inhale into the lungs every 6 (six) hours as needed for wheezing or shortness of breath.     cetirizine (ZYRTEC) 10 MG tablet Take 1 tablet (10 mg total) by mouth daily. 90 tablet 3   Cholecalciferol (VITAMIN D3) 5000 units CAPS 1 po qd 90 capsule 3   diphenhydrAMINE (BENADRYL) 25 MG tablet Take 2 tablets (50 mg total) by mouth every 4 (four) hours as needed for allergies. 60 tablet 0   estradiol (ESTRACE VAGINAL) 0.1 MG/GM vaginal cream Place 1 g vaginally 3 (three) times a week. 42.5 g 12   famotidine (PEPCID) 40 MG tablet Take 1 tablet (40 mg total) by mouth daily. 90 tablet 3   fluticasone (FLONASE) 50 MCG/ACT nasal spray Place 1 spray into both nostrils daily. 16 g 11   levothyroxine (SYNTHROID, LEVOTHROID) 100 MCG tablet Take 1 tablet (100 mcg total) by mouth daily. (Patient taking differently: Take 100 mcg by mouth. Monday thru friday) 90 tablet 3   Levothyroxine Sodium (EUTHYROX PO) Take 125 mg by mouth. On the weekends     MAGNESIUM PO Take by mouth as needed. + melatonin     metFORMIN (GLUCOPHAGE-XR) 500 MG 24 hr tablet Take 500 mg by mouth daily with breakfast.     pseudoephedrine (SUDAFED) 30 MG tablet Take 2 tablets (60 mg total) by mouth every 4 (four) hours as needed for congestion. 30 tablet 0   UNABLE TO FIND Med Name: Tripliam 10/2,5/10     EPINEPHrine 0.3 mg/0.3 mL IJ SOAJ injection Inject 0.3 mg into the muscle as needed. 1 each 3   meloxicam (MOBIC) 15 MG tablet Take 1 tablet (15 mg total) by mouth daily. 30 tablet 0   No facility-administered medications prior to visit.    ROS: Review of Systems  Constitutional:  Positive for  fatigue. Negative for activity change, appetite change, chills and unexpected weight change.  HENT:  Negative for congestion, mouth sores and sinus pressure.   Eyes:  Negative for visual disturbance.  Respiratory:  Negative for cough and chest tightness.   Gastrointestinal:  Negative for abdominal pain and nausea.  Genitourinary:  Negative for difficulty urinating, frequency and vaginal pain.  Musculoskeletal:  Positive for arthralgias. Negative for back pain and gait problem.  Skin:  Negative for pallor and rash.  Neurological:  Negative for dizziness, tremors, weakness, numbness and headaches.  Psychiatric/Behavioral:  Negative for confusion, dysphoric mood, sleep disturbance and suicidal ideas. The patient is nervous/anxious.     Objective:  BP 118/80 (BP Location: Left Arm, Patient Position: Sitting, Cuff Size: Large)   Pulse 90   Temp 98.7 F (37.1 C) (Oral)   Ht 5' 6.75" (1.695 m)   Wt 250 lb (113.4 kg)   LMP 06/29/2017 (Approximate)   SpO2 95%   BMI 39.45 kg/m   BP Readings from Last 3 Encounters:  02/18/22 (!) 118/92  02/16/22 118/80  08/19/21 120/68    Wt Readings from Last 3 Encounters:  02/18/22 256 lb (116.1 kg)  02/16/22 250 lb (113.4 kg)  08/19/21 253 lb 12.8 oz (115.1 kg)    Physical Exam Constitutional:      General:  She is not in acute distress.    Appearance: She is well-developed. She is obese.  HENT:     Head: Normocephalic.     Right Ear: External ear normal.     Left Ear: External ear normal.     Nose: Nose normal.  Eyes:     General:        Right eye: No discharge.        Left eye: No discharge.     Conjunctiva/sclera: Conjunctivae normal.     Pupils: Pupils are equal, round, and reactive to light.  Neck:     Thyroid: No thyromegaly.     Vascular: No JVD.     Trachea: No tracheal deviation.  Cardiovascular:     Rate and Rhythm: Normal rate and regular rhythm.     Heart sounds: Normal heart sounds.  Pulmonary:     Effort: No respiratory  distress.     Breath sounds: No stridor. No wheezing.  Abdominal:     General: Bowel sounds are normal. There is no distension.     Palpations: Abdomen is soft. There is no mass.     Tenderness: There is no abdominal tenderness. There is no guarding or rebound.  Musculoskeletal:        General: Tenderness present.     Cervical back: Normal range of motion and neck supple. No rigidity.  Lymphadenopathy:     Cervical: No cervical adenopathy.  Skin:    Findings: No erythema or rash.  Neurological:     Mental Status: She is oriented to person, place, and time.     Cranial Nerves: No cranial nerve deficit.     Motor: No abnormal muscle tone.     Coordination: Coordination normal.     Gait: Gait normal.     Deep Tendon Reflexes: Reflexes normal.  Psychiatric:        Behavior: Behavior normal.        Thought Content: Thought content normal.        Judgment: Judgment normal.   R thumb base and abductor pollicis longus tendon are painful  Lab Results  Component Value Date   WBC 6.4 02/18/2022   HGB 13.2 02/18/2022   HCT 39.4 02/18/2022   PLT 238.0 02/18/2022   GLUCOSE 105 (H) 02/18/2022   CHOL 178 02/18/2022   TRIG 154.0 (H) 02/18/2022   HDL 49.70 02/18/2022   LDLCALC 98 02/18/2022   ALT 29 02/18/2022   AST 27 02/18/2022   NA 142 02/18/2022   K 3.3 (L) 02/18/2022   CL 103 02/18/2022   CREATININE 1.17 02/18/2022   BUN 12 02/18/2022   CO2 27 02/18/2022   TSH 5.91 (H) 02/18/2022    CT CARDIAC SCORING (SELF PAY ONLY)  Addendum Date: 08/26/2021   ADDENDUM REPORT: 08/26/2021 15:40 CLINICAL DATA:  Risk stratification: 52 Year-old African American Female EXAM: Coronary Calcium Score TECHNIQUE: The patient was scanned on a [employer] scanner. Axial non-contrast 3 mm slices were carried out through the heart. The data set was analyzed on a dedicated work station and scored using the [place]. FINDINGS: Non-cardiac: See separate report from [facility]. Ascending  Aorta: Normal caliber. No calcifications. Aortic Valve Calcium score: 0 Mitral annular calcification: none Pericardium: Normal. Coronary arteries: Normal origins. Coronary Calcium Score: Left main: 0 Left anterior descending artery: 0 Left circumflex artery: 0 Right coronary artery: 0 Total: 0 Percentile: 1st for age, sex, and race matched control. IMPRESSION: 1. Coronary calcium score of 0. This was  1st percentile for age, gender, and race matched controls. RECOMMENDATIONS: Coronary artery calcium (CAC) score is a strong predictor of incident coronary heart disease (CHD) and provides predictive information beyond traditional risk factors. CAC scoring is reasonable to use in the decision to withhold, postpone, or initiate statin therapy in intermediate-risk or selected borderline-risk asymptomatic adults (age 50-75 years and LDL-C >=70 to <190 mg/dL) who do not have diabetes or established atherosclerotic cardiovascular disease (ASCVD).* In intermediate-risk (10-year ASCVD risk >=7.5% to <20%) adults or selected borderline-risk (10-year ASCVD risk >=5% to <7.5%) adults in whom a CAC score is measured for the purpose of making a treatment decision the following recommendations have been made: If CAC = 0, it is reasonable to withhold statin therapy and reassess in 5 to 10 years, as long as higher risk conditions are absent (diabetes mellitus, family history of premature CHD in first degree relatives (males <55 years; females <65 years), cigarette smoking, LDL >=190 mg/dL or other independent risk factors). If CAC is 1 to 99, it is reasonable to initiate statin therapy for patients >=40 years of age. If CAC is >=100 or >=75th percentile, it is reasonable to initiate statin therapy at any age. Cardiology referral should be considered for patients with CAC scores =400 or >=75th percentile. *2018 AHA/ACC/AACVPR/AAPA/ABC/ACPM/ADA/AGS/APhA/ASPC/NLA/PCNA Guideline on the Management of Blood Cholesterol: A Report of the  American College of Cardiology/American Heart Association Task Force on Clinical Practice Guidelines. J Am Coll Cardiol. 2019;73(24):3168-3209. Rudean Haskell, MD Electronically Signed   By: Rudean Haskell M.D.   On: 08/26/2021 15:40   Result Date: 08/26/2021 EXAM: OVER-READ INTERPRETATION  CT CHEST The following report is a limited chest CT over-read performed by radiologist Dr. Abigail Miyamoto of [facility], [place] on 08/26/2021. This over-read does not include interpretation of cardiac or coronary anatomy or pathology. The calcium score interpretation by the cardiologist is attached. COMPARISON:  07/08/2017 chest radiograph FINDINGS: Vascular: Normal aortic caliber. Mediastinum/Nodes: No imaged thoracic adenopathy. Lungs/Pleura: No pleural fluid.  Clear imaged lungs. Upper Abdomen: Mild hepatic steatosis. Normal imaged portions of the spleen, stomach, adrenal glands, left kidney. Musculoskeletal: No acute osseous abnormality. IMPRESSION: No acute findings in the imaged extracardiac chest. Hepatic steatosis. Electronically Signed: By: Abigail Miyamoto M.D. On: 08/26/2021 13:31    Assessment & Plan:   Problem List Items Addressed This Visit       Cardiovascular and Mediastinum   Essential hypertension    BP Readings from Last 3 Encounters:  02/16/22 118/80  08/19/21 120/68  07/31/21 120/78  Will continue to monitor blood pressure.  Currently off blood pressure meds      Relevant Medications   EPINEPHrine 0.3 mg/0.3 mL IJ SOAJ injection     Endocrine   Hypothyroid - Primary    Check labs      Relevant Orders   TSH   Urinalysis   CBC with Differential/Platelet (Completed)   Lipid panel (Completed)   Comprehensive metabolic panel (Completed)   TSH (Completed)   T3, free (Completed)   T4, free (Completed)     Other   Vitamin D deficiency    Check Vit D      Polyarthralgia    Labs Blue-Emu cream was recommended to use 2-3 times a day Ergonomic workstation        Relevant Orders   Rheumatoid factor (Completed)   Sedimentation rate (Completed)   Vitamin B12 (Completed)   VITAMIN D 25 Hydroxy (Vit-D Deficiency, Fractures) (Completed)   Fatigue   Relevant Orders  TSH   Urinalysis   CBC with Differential/Platelet (Completed)   Lipid panel (Completed)   Comprehensive metabolic panel (Completed)   TSH (Completed)   T3, free (Completed)   T4, free (Completed)   Allergy to bee sting    EpiPen renewed.         Meds ordered this encounter  Medications   EPINEPHrine 0.3 mg/0.3 mL IJ SOAJ injection    Sig: Inject 0.3 mg into the muscle as needed.    Dispense:  1 each    Refill:  3      Follow-up: Return in about 6 months (around 08/17/2022) for a follow-up visit.  Walker Kehr, MD

## 2022-02-16 NOTE — Patient Instructions (Signed)
Brixx jar key  Blue-Emu cream --use 2-3 times a day

## 2022-02-16 NOTE — Assessment & Plan Note (Signed)
Check Vit D

## 2022-02-16 NOTE — Assessment & Plan Note (Addendum)
Labs Blue-Emu cream was recommended to use 2-3 times a day Ergonomic workstation

## 2022-02-16 NOTE — Progress Notes (Unsigned)
[facility] [place] Phone: [phone] Subjective:   Laurie Hawkins, am serving as a scribe for Dr. Hulan Saas.  I'm seeing this patient by the request  of:  Plotnikov, Evie Lacks, MD  CC: Hand pain  XAJ:[id number]  07/30/2021 Patient may have an exacerbation. Volume noted. Discussed the possibility of possible bracing initially. Given some mild responses. Discussed the possibility of home exercises otherwise. Worsening. Would like to consider the x-rays. We will follow-up with me again in 6 weeks   Update 02/18/2022 Laurie Hawkins is a 52 y.o. female coming in with complaint of B thumb pain. Patient states that her pain is constant in both CMC joints.      Past Medical History:  Diagnosis Date   Asthma    Hypertension    Prediabetes    Thyroid cancer ([place])    Thyroid disease    Past Surgical History:  Procedure Laterality Date   CESAREAN SECTION  2009   THYROIDECTOMY  2011   Social History   Socioeconomic History   Marital status: Married    Spouse name: Not on file   Number of children: Not on file   Years of education: Not on file   Highest education level: Not on file  Occupational History   Not on file  Tobacco Use   Smoking status: Never   Smokeless tobacco: Never  Vaping Use   Vaping Use: Never used  Substance and Sexual Activity   Alcohol use: Not Currently    Comment: 1/2 a beer a week   Drug use: Hawkins   Sexual activity: Not Currently    Partners: Male    Birth control/protection: Post-menopausal  Other Topics Concern   Not on file  Social History Narrative   Not on file   Social Determinants of Health   Financial Resource Strain: Not on file  Food Insecurity: Not on file  Transportation Needs: Not on file  Physical Activity: Not on file  Stress: Not on file  Social Connections: Not on file   Allergies  Allergen Reactions   Bee Venom Anaphylaxis   Ace Inhibitors      Angioedema    Family History  Problem Relation Age of Onset   Stroke Mother    Hypertension Mother    Breast cancer Mother    Stroke Father    Hypertension Father    Prostate cancer Father    Heart disease Father    Congestive Heart Failure Father    Hypertension Sister    Hypertension Sister    Diabetes Maternal Grandfather     Current Outpatient Medications (Endocrine & Metabolic):    levothyroxine (SYNTHROID, LEVOTHROID) 100 MCG tablet, Take 1 tablet (100 mcg total) by mouth daily. (Patient taking differently: Take 100 mcg by mouth. Monday thru friday)   Levothyroxine Sodium (EUTHYROX PO), Take 125 mg by mouth. On the weekends   metFORMIN (GLUCOPHAGE-XR) 500 MG 24 hr tablet, Take 500 mg by mouth daily with breakfast.  Current Outpatient Medications (Cardiovascular):    EPINEPHrine 0.3 mg/0.3 mL IJ SOAJ injection, Inject 0.3 mg into the muscle as needed.  Current Outpatient Medications (Respiratory):    albuterol (PROVENTIL HFA;VENTOLIN HFA) 108 (90 BASE) MCG/ACT inhaler, Inhale into the lungs every 6 (six) hours as needed for wheezing or shortness of breath.   cetirizine (ZYRTEC) 10 MG tablet, Take 1 tablet (10 mg total) by mouth daily.   diphenhydrAMINE (BENADRYL) 25 MG tablet, Take 2  tablets (50 mg total) by mouth every 4 (four) hours as needed for allergies.   fluticasone (FLONASE) 50 MCG/ACT nasal spray, Place 1 spray into both nostrils daily.   pseudoephedrine (SUDAFED) 30 MG tablet, Take 2 tablets (60 mg total) by mouth every 4 (four) hours as needed for congestion.  Current Outpatient Medications (Analgesics):    meloxicam (MOBIC) 15 MG tablet, Take 1 tablet (15 mg total) by mouth daily.   Current Outpatient Medications (Other):    Cholecalciferol (VITAMIN D3) 5000 units CAPS, 1 po qd   estradiol (ESTRACE VAGINAL) 0.1 MG/GM vaginal cream, Place 1 g vaginally 3 (three) times a week.   famotidine (PEPCID) 40 MG tablet, Take 1 tablet (40 mg total) by mouth daily.    MAGNESIUM PO, Take by mouth as needed. + melatonin   UNABLE TO FIND, Med Name: Jobie Quaker 10/2,5/10   Reviewed prior external information including notes and imaging from  primary care provider As well as notes that were available from care everywhere and other healthcare systems.  Past medical history, social, surgical and family history all reviewed in electronic medical record.  Hawkins pertanent information unless stated regarding to the chief complaint.   Review of Systems:  Hawkins headache, visual changes, nausea, vomiting, diarrhea, constipation, dizziness, abdominal pain, skin rash, fevers, chills, night sweats, weight loss, swollen lymph nodes, body aches, joint swelling, chest pain, shortness of breath, mood changes. POSITIVE muscle aches  Objective  Blood pressure (!) 118/92, pulse 88, height '5\' 6"'$  (1.676 m), weight 256 lb (116.1 kg), last menstrual period 06/29/2017, SpO2 97 %.   General: Hawkins apparent distress alert and oriented x3 mood and affect normal, dressed appropriately.  HEENT: Pupils equal, extraocular movements intact  Respiratory: Patient's speak in full sentences and does not appear short of breath  Cardiovascular: Hawkins lower extremity edema, non tender, Hawkins erythema  Patient does have tenderness to palpation over the thumbs bilaterally.  Positive grind test noted.  Patient does have some difficulty with certain range of motion.  Limited muscular skeletal ultrasound was performed and interpreted by Hulan Saas, M  Limited ultrasound shows that patient does have some hypoechoic changes with questionable uric acid formation in the [facility] joints bilaterally.  Procedure: Real-time Ultrasound Guided Injection of right CMC joint Device: GE Logiq Q7 Ultrasound guided injection is preferred based studies that show increased duration, increased effect, greater accuracy, decreased procedural pain, increased response rate, and decreased cost with ultrasound guided versus blind injection.   Verbal informed consent obtained.  Time-out conducted.  Noted Hawkins overlying erythema, induration, or other signs of local infection.  Skin prepped in a sterile fashion.  Local anesthesia: Topical Ethyl chloride.  With sterile technique and under real time ultrasound guidance: With a 25-gauge half inch needle injected with 0.5 cc of 0.5% Marcaine and 0.5 cc of Kenalog 40 mg/mL Completed without difficulty  Pain immediately resolved suggesting accurate placement of the medication.  Advised to call if fevers/chills, erythema, induration, drainage, or persistent bleeding.  Impression: Technically successful ultrasound guided injection.  Procedure: Real-time Ultrasound Guided Injection of left CMC joint Device: GE Logiq Q7 Ultrasound guided injection is preferred based studies that show increased duration, increased effect, greater accuracy, decreased procedural pain, increased response rate, and decreased cost with ultrasound guided versus blind injection.  Verbal informed consent obtained.  Time-out conducted.  Noted Hawkins overlying erythema, induration, or other signs of local infection.  Skin prepped in a sterile fashion.  Local anesthesia: Topical Ethyl chloride.  With sterile technique and  under real time ultrasound guidance: With a 25-gauge half inch needle injected with 0.5 cc of 0.5% Marcaine and 0.5 cc of Kenalog 40 mg/mL Completed without difficulty  Pain immediately resolved suggesting accurate placement of the medication.  Advised to call if fevers/chills, erythema, induration, drainage, or persistent bleeding.  Impression: Technically successful ultrasound guided injection.    Impression and Recommendations:      The above documentation has been reviewed and is accurate and complete Lyndal Pulley, DO

## 2022-02-18 ENCOUNTER — Ambulatory Visit: Payer: 59 | Admitting: Family Medicine

## 2022-02-18 ENCOUNTER — Ambulatory Visit: Payer: Self-pay

## 2022-02-18 VITALS — BP 118/92 | HR 88 | Ht 66.0 in | Wt 256.0 lb

## 2022-02-18 DIAGNOSIS — R5383 Other fatigue: Secondary | ICD-10-CM | POA: Diagnosis not present

## 2022-02-18 DIAGNOSIS — E890 Postprocedural hypothyroidism: Secondary | ICD-10-CM | POA: Diagnosis not present

## 2022-02-18 DIAGNOSIS — M79641 Pain in right hand: Secondary | ICD-10-CM | POA: Diagnosis not present

## 2022-02-18 DIAGNOSIS — M2550 Pain in unspecified joint: Secondary | ICD-10-CM | POA: Diagnosis not present

## 2022-02-18 DIAGNOSIS — M79642 Pain in left hand: Secondary | ICD-10-CM

## 2022-02-18 DIAGNOSIS — M180 Bilateral primary osteoarthritis of first carpometacarpal joints: Secondary | ICD-10-CM | POA: Insufficient documentation

## 2022-02-18 LAB — URINALYSIS, ROUTINE W REFLEX MICROSCOPIC
Bilirubin Urine: NEGATIVE
Ketones, ur: NEGATIVE
Leukocytes,Ua: NEGATIVE
Nitrite: NEGATIVE
Specific Gravity, Urine: 1.025 (ref 1.000–1.030)
Total Protein, Urine: NEGATIVE
Urine Glucose: NEGATIVE
Urobilinogen, UA: 0.2 (ref 0.0–1.0)
pH: 6 (ref 5.0–8.0)

## 2022-02-18 LAB — CBC WITH DIFFERENTIAL/PLATELET
Basophils Absolute: 0 10*3/uL (ref 0.0–0.1)
Basophils Relative: 0.5 % (ref 0.0–3.0)
Eosinophils Absolute: 0.2 10*3/uL (ref 0.0–0.7)
Eosinophils Relative: 3.4 % (ref 0.0–5.0)
HCT: 39.4 % (ref 36.0–46.0)
Hemoglobin: 13.2 g/dL (ref 12.0–15.0)
Lymphocytes Relative: 44 % (ref 12.0–46.0)
Lymphs Abs: 2.8 10*3/uL (ref 0.7–4.0)
MCHC: 33.6 g/dL (ref 30.0–36.0)
MCV: 86.3 fl (ref 78.0–100.0)
Monocytes Absolute: 0.5 10*3/uL (ref 0.1–1.0)
Monocytes Relative: 8.4 % (ref 3.0–12.0)
Neutro Abs: 2.8 10*3/uL (ref 1.4–7.7)
Neutrophils Relative %: 43.7 % (ref 43.0–77.0)
Platelets: 238 10*3/uL (ref 150.0–400.0)
RBC: 4.56 Mil/uL (ref 3.87–5.11)
RDW: 14.5 % (ref 11.5–15.5)
WBC: 6.4 10*3/uL (ref 4.0–10.5)

## 2022-02-18 LAB — COMPREHENSIVE METABOLIC PANEL
ALT: 29 U/L (ref 0–35)
AST: 27 U/L (ref 0–37)
Albumin: 4.1 g/dL (ref 3.5–5.2)
Alkaline Phosphatase: 45 U/L (ref 39–117)
BUN: 12 mg/dL (ref 6–23)
CO2: 27 mEq/L (ref 19–32)
Calcium: 9.5 mg/dL (ref 8.4–10.5)
Chloride: 103 mEq/L (ref 96–112)
Creatinine, Ser: 1.17 mg/dL (ref 0.40–1.20)
GFR: 53.79 mL/min — ABNORMAL LOW (ref >60.00)
Glucose, Bld: 105 mg/dL — ABNORMAL HIGH (ref 70–99)
Potassium: 3.3 mEq/L — ABNORMAL LOW (ref 3.5–5.1)
Sodium: 142 mEq/L (ref 135–145)
Total Bilirubin: 0.3 mg/dL (ref 0.2–1.2)
Total Protein: 7.1 g/dL (ref 6.0–8.3)

## 2022-02-18 LAB — TSH: TSH: 5.91 u[IU]/mL — ABNORMAL HIGH (ref 0.35–5.50)

## 2022-02-18 LAB — LIPID PANEL
Cholesterol: 178 mg/dL (ref 0–200)
HDL: 49.7 mg/dL (ref >39.00)
LDL Cholesterol: 98 mg/dL (ref 0–99)
NonHDL: 128.79
Total CHOL/HDL Ratio: 4
Triglycerides: 154 mg/dL — ABNORMAL HIGH (ref 0.0–149.0)
VLDL: 30.8 mg/dL (ref 0.0–40.0)

## 2022-02-18 LAB — VITAMIN B12: Vitamin B-12: 182 pg/mL — ABNORMAL LOW (ref 211–911)

## 2022-02-18 LAB — URIC ACID: Uric Acid, Serum: 6.3 mg/dL (ref 2.4–7.0)

## 2022-02-18 LAB — T4, FREE: Free T4: 0.71 ng/dL (ref 0.60–1.60)

## 2022-02-18 LAB — SEDIMENTATION RATE: Sed Rate: 30 mm/hr (ref 0–30)

## 2022-02-18 LAB — VITAMIN D 25 HYDROXY (VIT D DEFICIENCY, FRACTURES): VITD: 25.65 ng/mL — ABNORMAL LOW (ref 30.00–100.00)

## 2022-02-18 LAB — T3, FREE: T3, Free: 3 pg/mL (ref 2.3–4.2)

## 2022-02-18 NOTE — Patient Instructions (Addendum)
Injected both thumbs Labs today See me when you need me

## 2022-02-18 NOTE — Assessment & Plan Note (Addendum)
Patient given injections at this time.  Discussed posture and ergonomics.  Increase activity slowly.  Follow-up with me again in 6-8 weeks

## 2022-02-19 LAB — RHEUMATOID FACTOR: Rheumatoid fact SerPl-aCnc: <14 IU/mL (ref <14)

## 2022-02-21 NOTE — Assessment & Plan Note (Signed)
EpiPen renewed.

## 2022-06-01 NOTE — Telephone Encounter (Signed)
Will route and close encounter for now.

## 2022-07-29 ENCOUNTER — Encounter: Payer: Self-pay | Admitting: Internal Medicine

## 2022-08-23 ENCOUNTER — Ambulatory Visit (INDEPENDENT_AMBULATORY_CARE_PROVIDER_SITE_OTHER): Payer: 59 | Admitting: Dermatology

## 2022-08-23 ENCOUNTER — Encounter: Payer: Self-pay | Admitting: Dermatology

## 2022-08-23 ENCOUNTER — Other Ambulatory Visit: Payer: Self-pay | Admitting: Dermatology

## 2022-08-23 VITALS — BP 142/95 | HR 63

## 2022-08-23 DIAGNOSIS — L309 Dermatitis, unspecified: Secondary | ICD-10-CM | POA: Diagnosis not present

## 2022-08-23 DIAGNOSIS — L608 Other nail disorders: Secondary | ICD-10-CM | POA: Diagnosis not present

## 2022-08-23 DIAGNOSIS — L810 Postinflammatory hyperpigmentation: Secondary | ICD-10-CM | POA: Diagnosis not present

## 2022-08-23 MED ORDER — BETAMETHASONE DIPROPIONATE 0.05 % EX OINT: TOPICAL_OINTMENT | CUTANEOUS | 3 refills | Status: AC

## 2022-08-23 MED ORDER — TACROLIMUS 0.1 % EX OINT: TOPICAL_OINTMENT | CUTANEOUS | 2 refills | Status: DC

## 2022-08-23 MED ORDER — SAFETY SEAL MISCELLANEOUS MISC: 1.0000 | Freq: Every morning | 0 refills | Status: AC

## 2022-08-23 NOTE — Progress Notes (Unsigned)
   New Patient Visit   Subjective  Laurie Hawkins is a 52 y.o. female who presents for the following: eczema  Patient states she has eczema located at the tops of feet that she would like to have examined. Patient reports the areas have been there for years. She reports the areas are bothersome. She states that she was diagnosed with eczema as a child but didn't start having issues until she became an adult. Patient reports she has previously been treated for these areas. 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 Pt has been given Tenovate, clobetasol and betamethasone in the past and says betamethasone has worked better than others. She states that her eczema gets worse in the summer and/or with stress. Patient has no Hx of skin cancer. Patient has no family history of skin cancer(s).    The following portions of the chart were reviewed this encounter and updated as appropriate: medications, allergies, medical history  Review of Systems:  No other skin or systemic complaints except as noted in HPI or Assessment and Plan.  Objective  Well appearing patient in no apparent distress; mood and affect are within normal limits.   A focused examination was performed of the following areas: Feet and arms  Relevant exam findings are noted in the Assessment and Plan.  Exam: Scaly pink papules coalescing to plaques  Exam: 3.5 mm brown linear streak at right nailplate of thumb       Assessment & Plan   Eczema  Chronic condition with duration or expected duration over one year. Currently well-controlled.   Treatment Plan:   - Continue using Betamethasone ointment as needed for flare-ups, but not for more than two weeks continuously. Follow with a two-week break. If needed, alternate with Tacrolimus ointment for two weeks.  Melononychia  Treatment Plan: - Documented the lentigo and linear melanonychia for ongoing monitoring. Pt will report any changes  in size, color, or appearance.  POST-INFLAMMATORY HYPERPIGMENTATION (PIH) Exam: hyperpigmented macules and/or patches at face   This is a benign condition that comes from having previous inflammation in the skin and will fade with time over months to sometimes years. Recommend daily sun protection including sunscreen SPF 30+ to sun-exposed areas. - Recommend treating any itchy or red areas on the skin quickly to prevent new areas of PIH. Treating with prescription medicines such as hydroquinone may help fade dark spots faster.    Treatment Plan:  -Melaxemic cream to affected area bid prn   -Pt advised [facility] will contact her regarding her prescription and told to respond promptly to facilitate timely delivery since [place] is [place].  No follow-ups on file.  Owens Shark, CMA, am acting as scribe for [employer], DO.   Documentation: I have reviewed the above documentation for accuracy and completeness, and I agree with the above.  Laurie Reusing, DO

## 2022-08-23 NOTE — Patient Instructions (Addendum)
Hi Laurie Hawkins ,  Thank you for visiting my office today. I appreciate your commitment to managing your eczema and am pleased to assist you in improving your skin health.  Here are the key instructions and recommendations from our consultation:  Eczema - Medications and Usage:   - Continue using Betamethasone ointment as needed for flare-ups, but not for more than two weeks continuously. Follow with a two-week break. If needed, alternate with Tacrolimus ointment for two weeks.  Post-Inflammatory Hyperpigmentation   - For hyperpigmentation, use a lightening cream containing transaminic acid, kojic acid, vitamin C, and niacinamide twice daily. This can be mixed with the topical steroid during flare-ups.  Melanonychia - We have documented the lentigo and linear melanonychia for ongoing monitoring. Please report any changes in size, color, or appearance.  - Lifestyle Adjustments:   - Avoid contact with chromium-tanned leathers and opt for naturally tanned leathers to prevent allergic reactions.   - Apply CeraVe moisturizer regularly and use Vaseline or Aquaphor to lock in moisture during dry, winter months.      [facility] will contact you regarding your prescriptions. Please respond promptly to facilitate timely delivery, especially if you are traveling abroad.   Please feel free to reach out if you have any questions or need further assistance. Looking forward to seeing you at your next appointment or sooner if any concerns arise.  Warm regards,  Dr. Langston Reusing, Dermatologist  Due to recent changes in healthcare laws, you may see results of your pathology and/or laboratory studies on MyChart before the doctors have had a chance to review them. We understand that in some cases there may be results that are confusing or concerning to you. Please understand that not all results are received at the same time and often the doctors may need to interpret multiple results in order to  provide you with the best plan of care or course of treatment. Therefore, we ask that you please give [place] 2 business days to thoroughly review all your results before contacting the office for clarification. Should we see a critical lab result, you will be contacted sooner.   If You Need Anything After Your Visit  If you have any questions or concerns for your doctor, please call our main line at [phone] If no one answers, please leave a voicemail as directed and we will return your call as soon as possible. Messages left after 4 pm will be answered the following business day.   You may also send [place] a message via MyChart. We typically respond to MyChart messages within 1-2 business days.  For prescription refills, please ask your pharmacy to contact our office. Our fax number is [phone].  If you have an urgent issue when the clinic is closed that cannot wait until the next business day, you can page your doctor at the number below.    Please note that while we do our best to be available for urgent issues outside of office hours, we are not available 24/7.   If you have an urgent issue and are unable to reach [place], you may choose to seek medical care at your doctor's office, retail clinic, urgent care center, or emergency room.  If you have a medical emergency, please immediately call 911 or go to the emergency department. In the event of inclement weather, please call our main line at [phone] for an update on the status of any delays or closures.  Dermatology Medication Tips: Please keep the boxes that  topical medications come in in order to help keep track of the instructions about where and how to use these. Pharmacies typically print the medication instructions only on the boxes and not directly on the medication tubes.   If your medication is too expensive, please contact our office at [phone] or send [place] a message through MyChart.   We are unable to tell what your  co-pay for medications will be in advance as this is different depending on your insurance coverage. However, we may be able to find a substitute medication at lower cost or fill out paperwork to get insurance to cover a needed medication.   If a prior authorization is required to get your medication covered by your insurance company, please allow [place] 1-2 business days to complete this process.  Drug prices often vary depending on where the prescription is filled and some pharmacies may offer cheaper prices.  The website www.goodrx.com contains coupons for medications through different pharmacies. The prices here do not account for what the cost may be with help from insurance (it may be cheaper with your insurance), but the website can give you the price if you did not use any insurance.  - You can print the associated coupon and take it with your prescription to the pharmacy.  - You may also stop by our office during regular business hours and pick up a GoodRx coupon card.  - If you need your prescription sent electronically to a different pharmacy, notify our office through [facility] or by phone at [phone]

## 2022-08-24 MED ORDER — PIMECROLIMUS 1 % EX CREA: TOPICAL_CREAM | CUTANEOUS | 3 refills | Status: AC

## 2022-08-25 NOTE — Progress Notes (Unsigned)
[facility] [address] [place] [address] Phone: [phone] Subjective:   Bruce Donath, am serving as a scribe for Dr. Antoine Primas.  I'm seeing this patient by the request  of:  Plotnikov, Georgina Quint, MD  CC: Hand pain follow-up  [id number]  Laurie Hawkins is a 52 y.o. female coming in with complaint of L hand pain.  Patient was seen 6 months ago and given bilateral CMC injections.  Patient states that she got her middle finger stuck in car door on Friday. Pain over the middle phalanx.   Also c/o B CMC joint pain, R>L. Wearing brace prn.    Lab work 6 months ago showed significantly low B12 as well as vitamin D.  Patient had normal inflammatory markers    Past Medical History:  Diagnosis Date   Asthma    Hypertension    Prediabetes    Thyroid cancer (HCC)    Thyroid disease    Past Surgical History:  Procedure Laterality Date   CESAREAN SECTION  2009   THYROIDECTOMY  2011   Social History   Socioeconomic History   Marital status: Married    Spouse name: Not on file   Number of children: Not on file   Years of education: Not on file   Highest education level: Not on file  Occupational History   Not on file  Tobacco Use   Smoking status: Never   Smokeless tobacco: Never  Vaping Use   Vaping status: Never Used  Substance and Sexual Activity   Alcohol use: Not Currently    Comment: 1/2 a beer a week   Drug use: No   Sexual activity: Not Currently    Partners: Male    Birth control/protection: Post-menopausal  Other Topics Concern   Not on file  Social History Narrative   Not on file   Social Determinants of Health   Financial Resource Strain: Not on file  Food Insecurity: Not on file  Transportation Needs: Not on file  Physical Activity: Not on file  Stress: Not on file  Social Connections: Not on file   Allergies  Allergen Reactions   Bee Venom Anaphylaxis   Ace Inhibitors     Angioedema     Family History  Problem Relation Age of Onset   Stroke Mother    Hypertension Mother    Breast cancer Mother    Stroke Father    Hypertension Father    Prostate cancer Father    Heart disease Father    Congestive Heart Failure Father    Hypertension Sister    Hypertension Sister    Diabetes Maternal Grandfather     Current Outpatient Medications (Endocrine & Metabolic):    levothyroxine (SYNTHROID, LEVOTHROID) 100 MCG tablet, Take 1 tablet (100 mcg total) by mouth daily. (Patient taking differently: Take 100 mcg by mouth. Monday thru friday)   Levothyroxine Sodium (EUTHYROX PO), Take 125 mg by mouth. On the weekends   metFORMIN (GLUCOPHAGE-XR) 500 MG 24 hr tablet, Take 500 mg by mouth daily with breakfast.  Current Outpatient Medications (Cardiovascular):    EPINEPHrine 0.3 mg/0.3 mL IJ SOAJ injection, Inject 0.3 mg into the muscle as needed.  Current Outpatient Medications (Respiratory):    albuterol (PROVENTIL HFA;VENTOLIN HFA) 108 (90 BASE) MCG/ACT inhaler, Inhale into the lungs every 6 (six) hours as needed for wheezing or shortness of breath.   cetirizine (ZYRTEC) 10 MG tablet, Take 1 tablet (10 mg total) by mouth daily.  diphenhydrAMINE (BENADRYL) 25 MG tablet, Take 2 tablets (50 mg total) by mouth every 4 (four) hours as needed for allergies.   fluticasone (FLONASE) 50 MCG/ACT nasal spray, Place 1 spray into both nostrils daily.   pseudoephedrine (SUDAFED) 30 MG tablet, Take 2 tablets (60 mg total) by mouth every 4 (four) hours as needed for congestion.  Current Outpatient Medications (Analgesics):    meloxicam (MOBIC) 15 MG tablet, Take 1 tablet (15 mg total) by mouth daily.   Current Outpatient Medications (Other):    betamethasone dipropionate (DIPROLENE) 0.05 % ointment, Apply to affected area bid for up to 2 weeks with flares   Cholecalciferol (VITAMIN D3) 5000 units CAPS, 1 po qd   estradiol (ESTRACE VAGINAL) 0.1 MG/GM vaginal cream, Place 1 g vaginally 3  (three) times a week.   famotidine (PEPCID) 40 MG tablet, Take 1 tablet (40 mg total) by mouth daily.   MAGNESIUM PO, Take by mouth as needed. + melatonin   pimecrolimus (ELIDEL) 1 % cream, Apply to affected area bid when taking break from steroid   Safety Seal Miscellaneous MISC, Apply 1 Application topically in the morning. MEDICATION NAME; Melaxemic Cream   UNABLE TO FIND, Med Name: Arne Cleveland 10/2,5/10   Reviewed prior external information including notes and imaging from  primary care provider As well as notes that were available from care everywhere and other healthcare systems.  Past medical history, social, surgical and family history all reviewed in electronic medical record.  No pertanent information unless stated regarding to the chief complaint.   Review of Systems:  No headache, visual changes, nausea, vomiting, diarrhea, constipation, dizziness, abdominal pain, skin rash, fevers, chills, night sweats, weight loss, swollen lymph nodes, body aches, joint swelling, chest pain, shortness of breath, mood changes. POSITIVE muscle aches  Objective  Last menstrual period 06/29/2017.   General: No apparent distress alert and oriented x3 mood and affect normal, dressed appropriately.  HEENT: Pupils equal, extraocular movements intact  Respiratory: Patient's speak in full sentences and does not appear short of breath  Cardiovascular: No lower extremity edema, non tender, no erythema  Hand exam does have some tenderness to palpation in the [facility] joint bilaterally.  Seems to have swelling in the right greater than left.  Patient has good range of motion though otherwise.  Procedure: Real-time Ultrasound Guided Injection of right cmc Device: GE Logiq Q7 Ultrasound guided injection is preferred based studies that show increased duration, increased effect, greater accuracy, decreased procedural pain, increased response rate, and decreased cost with ultrasound guided versus blind injection.   Verbal informed consent obtained.  Time-out conducted.  Noted no overlying erythema, induration, or other signs of local infection.  Skin prepped in a sterile fashion.  Local anesthesia: Topical Ethyl chloride.  With sterile technique and under real time ultrasound guidance: With a 25-gauge half inch needle injected with 0.5 cc of 0.5% Marcaine and 0.5 cc of Kenalog 40 mg/mL Completed without difficulty  Advised to call if fevers/chills, erythema, induration, drainage, or persistent bleeding.  Impression: Technically successful ultrasound guided injection.  Procedure: Real-time Ultrasound Guided Injection of left CMC joint Device: GE Logiq Q7 Ultrasound guided injection is preferred based studies that show increased duration, increased effect, greater accuracy, decreased procedural pain, increased response rate, and decreased cost with ultrasound guided versus blind injection.  Verbal informed consent obtained.  Time-out conducted.  Noted no overlying erythema, induration, or other signs of local infection.  Skin prepped in a sterile fashion.  Local anesthesia: Topical Ethyl chloride.  With sterile technique and under real time ultrasound guidance: With a 25-gauge half inch 0.5 cc of 0.5% Marcaine and 0.5 cc of Kenalog 40 mg/mL Completed without difficulty  Pain immediately resolved suggesting accurate placement of the medication.  Advised to call if fevers/chills, erythema, induration, drainage, or persistent bleeding.  Impression: Technically successful ultrasound guided injection.    Impression and Recommendations:    The above documentation has been reviewed and is accurate and complete Judi Saa, DO

## 2022-08-26 ENCOUNTER — Ambulatory Visit (INDEPENDENT_AMBULATORY_CARE_PROVIDER_SITE_OTHER): Payer: 59 | Admitting: Family Medicine

## 2022-08-26 ENCOUNTER — Other Ambulatory Visit: Payer: Self-pay

## 2022-08-26 ENCOUNTER — Encounter: Payer: Self-pay | Admitting: Family Medicine

## 2022-08-26 VITALS — BP 140/92 | HR 89 | Ht 66.0 in | Wt 258.0 lb

## 2022-08-26 DIAGNOSIS — M180 Bilateral primary osteoarthritis of first carpometacarpal joints: Secondary | ICD-10-CM

## 2022-08-26 DIAGNOSIS — M79642 Pain in left hand: Secondary | ICD-10-CM

## 2022-08-26 DIAGNOSIS — E538 Deficiency of other specified B group vitamins: Secondary | ICD-10-CM | POA: Diagnosis not present

## 2022-08-26 MED ORDER — CYANOCOBALAMIN 1000 MCG/ML IJ SOLN
1000.0000 ug | Freq: Once | INTRAMUSCULAR | Status: AC
  Administered 2022-08-26: 1000 ug via INTRAMUSCULAR

## 2022-08-26 NOTE — Assessment & Plan Note (Signed)
Attempted injection

## 2022-08-26 NOTE — Patient Instructions (Addendum)
Injected both CMC joints B12 injection today See me when you get back

## 2022-08-26 NOTE — Assessment & Plan Note (Signed)
Bilateral injection noted  Chronic and worsening.  Monitor for bracing at night Going back to [place] for next 3 months or more.  Refills as needed  RTC PRN

## 2022-09-05 ENCOUNTER — Encounter: Payer: Self-pay | Admitting: Internal Medicine

## 2022-10-15 ENCOUNTER — Other Ambulatory Visit: Payer: Self-pay | Admitting: Internal Medicine

## 2022-10-15 DIAGNOSIS — Z1212 Encounter for screening for malignant neoplasm of rectum: Secondary | ICD-10-CM

## 2022-10-15 DIAGNOSIS — Z1211 Encounter for screening for malignant neoplasm of colon: Secondary | ICD-10-CM

## 2022-10-22 LAB — COLOGUARD

## 2023-06-23 NOTE — Progress Notes (Signed)
 [facility] [address] [place] [address] Phone: [phone] Subjective:   IBryan Caprio, am serving as a scribe for Dr. Ronnell Coins.  I'm seeing this patient by the request  of:  Plotnikov, Oakley Bellman, MD  CC: hand and knee pain   [id number]  Laurie Hawkins is a 53 y.o. female coming in with complaint of hand and knee pain. B CMC jt injections: August 2024. B12 injection at that time as well. Patient states injections worked until they didn't for thumbs. R hand ring finger. L knee went out on her when she was just standing. Neck will catch sometimes.     Past Medical History:  Diagnosis Date   Asthma    Hypertension    Prediabetes    Thyroid  cancer (HCC)    Thyroid  disease    Past Surgical History:  Procedure Laterality Date   CESAREAN SECTION  2009   THYROIDECTOMY  2011   Social History   Socioeconomic History   Marital status: Married    Spouse name: Not on file   Number of children: Not on file   Years of education: Not on file   Highest education level: Not on file  Occupational History   Not on file  Tobacco Use   Smoking status: Never   Smokeless tobacco: Never  Vaping Use   Vaping status: Never Used  Substance and Sexual Activity   Alcohol use: Not Currently    Comment: 1/2 a beer a week   Drug use: No   Sexual activity: Not Currently    Partners: Male    Birth control/protection: Post-menopausal  Other Topics Concern   Not on file  Social History Narrative   Not on file   Social Drivers of Health   Financial Resource Strain: Not on file  Food Insecurity: Not on file  Transportation Needs: Not on file  Physical Activity: Not on file  Stress: Not on file  Social Connections: Not on file   Allergies  Allergen Reactions   Bee Venom Anaphylaxis   Ace Inhibitors     Angioedema    Family History  Problem Relation Age of Onset   Stroke Mother    Hypertension Mother    Breast cancer Mother     Stroke Father    Hypertension Father    Prostate cancer Father    Heart disease Father    Congestive Heart Failure Father    Hypertension Sister    Hypertension Sister    Diabetes Maternal Grandfather     Current Outpatient Medications (Endocrine & Metabolic):    levothyroxine  (SYNTHROID , LEVOTHROID) 100 MCG tablet, Take 1 tablet (100 mcg total) by mouth daily. (Patient taking differently: Take 100 mcg by mouth. Monday thru friday)   Levothyroxine  Sodium (EUTHYROX  PO), Take 125 mg by mouth. On the weekends   metFORMIN (GLUCOPHAGE-XR) 500 MG 24 hr tablet, Take 500 mg by mouth daily with breakfast.  Current Outpatient Medications (Cardiovascular):    EPINEPHrine  0.3 mg/0.3 mL IJ SOAJ injection, Inject 0.3 mg into the muscle as needed.  Current Outpatient Medications (Respiratory):    albuterol (PROVENTIL HFA;VENTOLIN HFA) 108 (90 BASE) MCG/ACT inhaler, Inhale into the lungs every 6 (six) hours as needed for wheezing or shortness of breath.   cetirizine  (ZYRTEC ) 10 MG tablet, Take 1 tablet (10 mg total) by mouth daily.   diphenhydrAMINE  (BENADRYL ) 25 MG tablet, Take 2 tablets (50 mg total) by mouth every 4 (four) hours as needed for  allergies.   fluticasone  (FLONASE ) 50 MCG/ACT nasal spray, Place 1 spray into both nostrils daily.   pseudoephedrine  (SUDAFED) 30 MG tablet, Take 2 tablets (60 mg total) by mouth every 4 (four) hours as needed for congestion.  Current Outpatient Medications (Analgesics):    meloxicam  (MOBIC ) 15 MG tablet, Take 1 tablet (15 mg total) by mouth daily.   Current Outpatient Medications (Other):    betamethasone  dipropionate (DIPROLENE ) 0.05 % ointment, Apply to affected area bid for up to 2 weeks with flares   Cholecalciferol (VITAMIN D3) 5000 units CAPS, 1 po qd   estradiol  (ESTRACE  VAGINAL) 0.1 MG/GM vaginal cream, Place 1 g vaginally 3 (three) times a week.   famotidine  (PEPCID ) 40 MG tablet, Take 1 tablet (40 mg total) by mouth daily.   MAGNESIUM PO, Take by  mouth as needed. + melatonin   pimecrolimus  (ELIDEL ) 1 % cream, Apply to affected area bid when taking break from steroid   Safety Seal Miscellaneous MISC, Apply 1 Application topically in the morning. MEDICATION NAME; Melaxemic Cream   UNABLE TO FIND, Med Name: Classie Cruise 10/2,5/10   Reviewed prior external information including notes and imaging from  primary care provider As well as notes that were available from care everywhere and other healthcare systems.  Past medical history, social, surgical and family history all reviewed in electronic medical record.  No pertanent information unless stated regarding to the chief complaint.   Review of Systems:  No headache, visual changes, nausea, vomiting, diarrhea, constipation, dizziness, abdominal pain, skin rash, fevers, chills, night sweats, weight loss, swollen lymph nodes, body aches, joint swelling, chest pain, shortness of breath, mood changes. POSITIVE muscle aches  Objective  Last menstrual period 06/29/2017.   General: No apparent distress alert and oriented x3 mood and affect normal, dressed appropriately.  HEENT: Pupils equal, extraocular movements intact  Respiratory: Patient's speak in full sentences and does not appear short of breath  Cardiovascular: No lower extremity edema, non tender, no erythema  Bilateral thumb exam show the patient does have a positive grind test noted.  No thenar eminence wasting noted.  Neurovascularly intact. Neck exam does have some loss lordosis.  Some limited sidebending bilaterally.  Negative Spurling's noted. Upper back does have tightness noted in the parascapular area right greater than left. Left knee does have some lateral tracking noted.  Positive patellar grind test noted.  No instability with valgus and varus force.  Procedure: Real-time Ultrasound Guided Injection of right CMC joint Device: GE Logiq Q7 Ultrasound guided injection is preferred based studies that show increased duration,  increased effect, greater accuracy, decreased procedural pain, increased response rate, and decreased cost with ultrasound guided versus blind injection.  Verbal informed consent obtained.  Time-out conducted.  Noted no overlying erythema, induration, or other signs of local infection.  Skin prepped in a sterile fashion.  Local anesthesia: Topical Ethyl chloride.  With sterile technique and under real time ultrasound guidance: With a 25-gauge half inch needle injected with 0.5 cc of 0.5% Marcaine and 0.5 cc of Kenalog  40 mg/mL Completed without difficulty  Pain immediately resolved suggesting accurate placement of the medication.  Advised to call if fevers/chills, erythema, induration, drainage, or persistent bleeding.   images saved Impression: Technically successful ultrasound guided injection.  Procedure: Real-time Ultrasound Guided Injection of left CMC joint Device: GE Logiq Q7 Ultrasound guided injection is preferred based studies that show increased duration, increased effect, greater accuracy, decreased procedural pain, increased response rate, and decreased cost with ultrasound guided versus blind  injection.  Verbal informed consent obtained.  Time-out conducted.  Noted no overlying erythema, induration, or other signs of local infection.  Skin prepped in a sterile fashion.  Local anesthesia: Topical Ethyl chloride.  With sterile technique and under real time ultrasound guidance: With a 25-gauge half inch needle injected with 0.5 cc of 0.5% Marcaine and 0.5 cc of Kenalog  40 mg/mL Completed without difficulty  Pain immediately resolved suggesting accurate placement of the medication.  Advised to call if fevers/chills, erythema, induration, drainage, or persistent bleeding.  Images saved Impression: Technically successful ultrasound guided injection.   After informed written and verbal consent, patient was seated on exam table. Left knee was prepped with alcohol swab and utilizing  anterolateral approach, patient's left knee space was injected with 4:1  marcaine 0.5%: Kenalog  40mg /dL. Patient tolerated the procedure well without immediate complications.   Impression and Recommendations:     The above documentation has been reviewed and is accurate and complete Masayoshi Couzens M Eldra Word, DO

## 2023-06-24 ENCOUNTER — Ambulatory Visit: Admitting: Family Medicine

## 2023-06-24 ENCOUNTER — Ambulatory Visit (INDEPENDENT_AMBULATORY_CARE_PROVIDER_SITE_OTHER)

## 2023-06-24 ENCOUNTER — Other Ambulatory Visit: Payer: Self-pay

## 2023-06-24 ENCOUNTER — Encounter: Payer: Self-pay | Admitting: Family Medicine

## 2023-06-24 VITALS — BP 122/90 | HR 80 | Ht 66.0 in | Wt 260.0 lb

## 2023-06-24 DIAGNOSIS — M180 Bilateral primary osteoarthritis of first carpometacarpal joints: Secondary | ICD-10-CM | POA: Diagnosis not present

## 2023-06-24 DIAGNOSIS — M25562 Pain in left knee: Secondary | ICD-10-CM

## 2023-06-24 DIAGNOSIS — M79642 Pain in left hand: Secondary | ICD-10-CM

## 2023-06-24 DIAGNOSIS — M79641 Pain in right hand: Secondary | ICD-10-CM | POA: Diagnosis not present

## 2023-06-24 DIAGNOSIS — M222X1 Patellofemoral disorders, right knee: Secondary | ICD-10-CM

## 2023-06-24 DIAGNOSIS — M222X2 Patellofemoral disorders, left knee: Secondary | ICD-10-CM

## 2023-06-24 NOTE — Assessment & Plan Note (Signed)
 Chronic problem with worsening symptoms, now had actually inflammation noted.  Given an injection today.  Discussed icing regimen and home exercises, discussed which activities to do and which ones to avoid.  Increase activity slowly.  Follow-up with me again in 6 to 8 weeks otherwise worsening pain could be a candidate for possible viscosupplementation.

## 2023-06-24 NOTE — Patient Instructions (Addendum)
 Injection in thumbs and L knee See you again in 6 weeks Do prescribed exercises at least 3x a week Xray today

## 2023-06-24 NOTE — Assessment & Plan Note (Signed)
 Bilaterally ductions given today and tolerated the procedure well, discussed icing regimen of home exercises, which activities to do in which ones to avoid.  Still wants to avoid any type of surgical intervention.  Has had mouth bleeding injections over the course of the year.  Hopeful that this will start to calm down.

## 2023-06-25 ENCOUNTER — Emergency Department (HOSPITAL_BASED_OUTPATIENT_CLINIC_OR_DEPARTMENT_OTHER)
Admission: EM | Admit: 2023-06-25 | Discharge: 2023-06-25 | Disposition: A | Discharge status: Home / Self Care | Attending: Emergency Medicine | Admitting: Emergency Medicine

## 2023-06-25 ENCOUNTER — Other Ambulatory Visit: Payer: Self-pay

## 2023-06-25 ENCOUNTER — Encounter (HOSPITAL_BASED_OUTPATIENT_CLINIC_OR_DEPARTMENT_OTHER): Payer: Self-pay | Admitting: *Deleted

## 2023-06-25 ENCOUNTER — Emergency Department (HOSPITAL_BASED_OUTPATIENT_CLINIC_OR_DEPARTMENT_OTHER)

## 2023-06-25 DIAGNOSIS — R059 Cough, unspecified: Secondary | ICD-10-CM | POA: Diagnosis present

## 2023-06-25 DIAGNOSIS — Z85850 Personal history of malignant neoplasm of thyroid: Secondary | ICD-10-CM | POA: Diagnosis not present

## 2023-06-25 DIAGNOSIS — J45909 Unspecified asthma, uncomplicated: Secondary | ICD-10-CM | POA: Insufficient documentation

## 2023-06-25 DIAGNOSIS — I10 Essential (primary) hypertension: Secondary | ICD-10-CM | POA: Insufficient documentation

## 2023-06-25 DIAGNOSIS — R042 Hemoptysis: Secondary | ICD-10-CM | POA: Insufficient documentation

## 2023-06-25 DIAGNOSIS — R052 Subacute cough: Secondary | ICD-10-CM | POA: Insufficient documentation

## 2023-06-25 LAB — BASIC METABOLIC PANEL WITH GFR
Anion gap: 14 (ref 5–15)
BUN: 17 mg/dL (ref 6–20)
CO2: 21 mmol/L — ABNORMAL LOW (ref 22–32)
Calcium: 9.8 mg/dL (ref 8.9–10.3)
Chloride: 105 mmol/L (ref 98–111)
Creatinine, Ser: 1.1 mg/dL — ABNORMAL HIGH (ref 0.44–1.00)
GFR, Estimated: 60 mL/min — ABNORMAL LOW (ref >60)
Glucose, Bld: 128 mg/dL — ABNORMAL HIGH (ref 70–99)
Potassium: 3.9 mmol/L (ref 3.5–5.1)
Sodium: 140 mmol/L (ref 135–145)

## 2023-06-25 LAB — HEPATIC FUNCTION PANEL
ALT: 42 U/L (ref 0–44)
AST: 33 U/L (ref 15–41)
Albumin: 4.4 g/dL (ref 3.5–5.0)
Alkaline Phosphatase: 64 U/L (ref 38–126)
Bilirubin, Direct: 0.1 mg/dL (ref 0.0–0.2)
Indirect Bilirubin: 0.1 mg/dL — ABNORMAL LOW (ref 0.3–0.9)
Total Bilirubin: 0.3 mg/dL (ref 0.0–1.2)
Total Protein: 7.6 g/dL (ref 6.5–8.1)

## 2023-06-25 LAB — CBC
HCT: 38 % (ref 36.0–46.0)
Hemoglobin: 12.7 g/dL (ref 12.0–15.0)
MCH: 28.3 pg (ref 26.0–34.0)
MCHC: 33.4 g/dL (ref 30.0–36.0)
MCV: 84.8 fL (ref 80.0–100.0)
Platelets: 241 10*3/uL (ref 150–400)
RBC: 4.48 MIL/uL (ref 3.87–5.11)
RDW: 13.9 % (ref 11.5–15.5)
WBC: 13 10*3/uL — ABNORMAL HIGH (ref 4.0–10.5)
nRBC: 0 % (ref 0.0–0.2)

## 2023-06-25 LAB — D-DIMER, QUANTITATIVE: D-Dimer, Quant: 0.42 ug{FEU}/mL (ref 0.00–0.50)

## 2023-06-25 LAB — TROPONIN T, HIGH SENSITIVITY
Troponin T High Sensitivity: <15 ng/L (ref <19)
Troponin T High Sensitivity: <15 ng/L (ref <19)

## 2023-06-25 LAB — SARS CORONAVIRUS 2 BY RT PCR: SARS Coronavirus 2 by RT PCR: NEGATIVE

## 2023-06-25 MED ORDER — BENZONATATE 100 MG PO CAPS
100.0000 mg | ORAL_CAPSULE | Freq: Three times a day (TID) | ORAL | 0 refills | Status: AC
Start: 2023-06-25 — End: ?

## 2023-06-25 MED ORDER — PREDNISONE 50 MG PO TABS
60.0000 mg | ORAL_TABLET | Freq: Once | ORAL | Status: AC
  Administered 2023-06-25: 60 mg via ORAL
  Filled 2023-06-25: qty 1

## 2023-06-25 MED ORDER — AZITHROMYCIN 250 MG PO TABS: 250.0000 mg | ORAL_TABLET | Freq: Every day | ORAL | 0 refills | Status: DC

## 2023-06-25 MED ORDER — BENZONATATE 100 MG PO CAPS
100.0000 mg | ORAL_CAPSULE | Freq: Once | ORAL | Status: AC
  Filled 2023-06-25: qty 1

## 2023-06-25 MED ORDER — PREDNISONE 10 MG PO TABS: 40.0000 mg | ORAL_TABLET | Freq: Every day | ORAL | 0 refills | Status: AC

## 2023-06-25 NOTE — ED Provider Notes (Signed)
 [facility] Emergency Department Provider Note MRN:  [id number]  Arrival date & time: 06/25/23     Chief Complaint   Cough   History of Present Illness   Laurie Hawkins is a 53 y.o. year-old female with a history of thyroid  cancer, hypertension, asthma presenting to the ED with chief complaint of cough.  Persistent barking cough for the past 2 weeks.  This evening coughing up blood and some increased shortness of breath, occasional chest pain.  Denies fever, no syncopal episodes or lightheadedness, no leg pain or swelling.  Recent 10-hour flight, here visiting, lives in [place].  Review of Systems  A thorough review of systems was obtained and all systems are negative except as noted in the HPI and PMH.   Patient's Health History    Past Medical History:  Diagnosis Date   Asthma    Hypertension    Prediabetes    Thyroid  cancer (HCC)    Thyroid  disease     Past Surgical History:  Procedure Laterality Date   CESAREAN SECTION  2009   THYROIDECTOMY  2011    Family History  Problem Relation Age of Onset   Stroke Mother    Hypertension Mother    Breast cancer Mother    Stroke Father    Hypertension Father    Prostate cancer Father    Heart disease Father    Congestive Heart Failure Father    Hypertension Sister    Hypertension Sister    Diabetes Maternal Grandfather     Social History   Socioeconomic History   Marital status: Married    Spouse name: Not on file   Number of children: Not on file   Years of education: Not on file   Highest education level: Not on file  Occupational History   Not on file  Tobacco Use   Smoking status: Never   Smokeless tobacco: Never  Vaping Use   Vaping status: Never Used  Substance and Sexual Activity   Alcohol use: Not Currently    Comment: 1/2 a beer a week   Drug use: No   Sexual activity: Not Currently    Partners: Male    Birth control/protection: Post-menopausal  Other Topics Concern   Not on  file  Social History Narrative   Not on file   Social Drivers of Health   Financial Resource Strain: Not on file  Food Insecurity: Not on file  Transportation Needs: Not on file  Physical Activity: Not on file  Stress: Not on file  Social Connections: Not on file  Intimate Partner Violence: Not on file     Physical Exam   Vitals:   06/25/23 0550  BP: 134/84  Pulse: 71  Resp: (!) 22  Temp: 97.9 F (36.6 C)  SpO2: 98%    CONSTITUTIONAL: Well-appearing, NAD NEURO/PSYCH:  Alert and oriented x 3, no focal deficits EYES:  eyes equal and reactive ENT/NECK:  no LAD, no JVD CARDIO: Regular rate, well-perfused, normal S1 and S2 PULM:  CTAB no wheezing or rhonchi GI/GU:  non-distended, non-tender MSK/SPINE:  No gross deformities, no edema SKIN:  no rash, atraumatic   *Additional and/or pertinent findings included in MDM below  Diagnostic and Interventional Summary    EKG Interpretation Date/Time:  Saturday June 25 2023 06:28:53 EDT Ventricular Rate:  60 PR Interval:  192 QRS Duration:  106 QT Interval:  431 QTC Calculation: 431 R Axis:   30  Text Interpretation: Sinus rhythm Confirmed by Gwenetta Lennert (  [id number]) on 06/25/2023 6:46:48 AM       Labs Reviewed  CBC - Abnormal; Notable for the following components:      Result Value   WBC 13.0 (*)    All other components within normal limits  SARS CORONAVIRUS 2 BY RT PCR  BASIC METABOLIC PANEL WITH GFR  D-DIMER, QUANTITATIVE  TROPONIN T, HIGH SENSITIVITY    DG Chest Port 1 View  Final Result      Medications - No data to display   Procedures  /  Critical Care Procedures  ED Course and Medical Decision Making  Initial Impression and Ddx Suspect bronchitis causing hemoptysis, clinically low concern for PE, would consider her low risk and appropriate for D-dimer study.  Past medical/surgical history that increases complexity of ED encounter: Thyroid  cancer  Interpretation of Diagnostics I personally reviewed  the EKG and my interpretation is as follows: Sinus rhythm  Labs, x-ray pending  Patient Reassessment and Ultimate Disposition/Management     Signed out to oncoming provider at shift change.  Patient management required discussion with the following services or consulting groups:  None  Complexity of Problems Addressed Acute illness or injury that poses threat of life of bodily function  Additional Data Reviewed and Analyzed Further history obtained from: Further history from spouse/family member  Additional Factors Impacting ED Encounter Risk Consideration of hospitalization  Merrick Abe. Harless Lien, MD [facility] Health Emergency Medicine [facility] [email or web address]  Final Clinical Impressions(s) / ED Diagnoses     ICD-10-CM   1. Subacute cough  R05.2     2. Hemoptysis  R04.2       ED Discharge Orders     None        Discharge Instructions Discussed with and Provided to Patient:   Discharge Instructions   None      Edson Graces, MD 06/25/23 [phone]

## 2023-06-25 NOTE — Discharge Instructions (Addendum)
 Your workup was overall reassuring to include a negative D-dimer, cardiac enzyme, chest x-ray that was clear, ultrasound of the gallbladder that showed no evidence of gallstones or other abnormality.  The remaining laboratory workup was reassuring.  Your symptoms could be due to acute bronchitis.  If you have persistent symptoms despite outpatient treatment, recommend that you follow-up for CT imaging to further evaluate other etiologies of your cough and mild hemoptysis.  We discussed CT imaging in the emergency department today and you have elected to defer that at this time.

## 2023-06-25 NOTE — ED Triage Notes (Signed)
 Cough x 2 weeks. This morning noticed blood in her sputum. Lives in [place] currently and recently arrived here to visit mom. Denies CP, c/o generalized abd pain

## 2023-06-25 NOTE — ED Provider Notes (Signed)
  Physical Exam  BP 134/84   Pulse 71   Temp 97.9 F (36.6 C)   Resp (!) 22   LMP 06/29/2017 (Approximate)   SpO2 98%     Procedures  Procedures  ED Course / MDM    Medical Decision Making Amount and/or Complexity of Data Reviewed Labs: ordered. Radiology: ordered. ECG/medicine tests: ordered.  Risk Prescription drug management.   70F presenting with a cough for the past 2 weeks, having hemoptysis, complaining of SOB. Waiting on a dimer.    Notified by nursing that the d-dimer machine was down in lab. Being sent to [facility] to run.   Dimer resulted negative. Discussed with the patient further imaging given her hemoptysis and cough.  Discussed that malignancy and PE is less likely in the setting of a negative dimer.  Symptoms could be due to acute viral bronchitis with very mild hemoptysis and cough for the past 2 weeks.  Occasional chest discomfort but on further exam, the patient was also endorsing some right upper quadrant discomfort and had mild tenderness on exam, negative Murphy sign.  Will obtain right upper quadrant ultrasound as well as hepatic function panel to rule out biliary etiology that could be contributing to the patient's discomfort.  After considering the risk and benefits, the patient elected to forego CT imaging at this time and will elect to treat for bronchitis and will obtain CT imaging if symptoms do not improve.  RUQ US :  IMPRESSION:  No evidence of cholelithiasis or biliary ductal dilatation.    Diffuse hepatic steatosis.    Labs: D-dimer negative, COVID-negative, troponin normal, hepatic function panel unremarkable, CBC with a nonspecific leukocytosis to 13.   Symptoms most consistent with acute bronchitis, will discharge with a course of prednisone , Tessalon and a Z-Pak.  Advises the patient remains symptomatic to follow-up outpatient for further advanced imaging of the chest.  Return precautions provided, stable for discharge.      Rosealee Concha, MD 06/25/23 [phone]

## 2023-06-26 ENCOUNTER — Ambulatory Visit: Payer: Self-pay | Admitting: Family Medicine

## 2023-06-30 LAB — COLOGUARD: COLOGUARD: NEGATIVE

## 2023-07-01 IMAGING — MG MM DIGITAL SCREENING BILAT W/ TOMO AND CAD
8 series · 8 of 24 positions shown · non-contrast
Comparison: Previous exam(s).

CLINICAL DATA: Screening.

EXAM:
DIGITAL SCREENING BILATERAL MAMMOGRAM WITH TOMOSYNTHESIS AND CAD
TECHNIQUE: Bilateral screening digital craniocaudal and mediolateral oblique
mammograms were obtained. Bilateral screening digital breast
tomosynthesis was performed. The images were evaluated with
computer-aided detection.

[R MLO synth-2D]
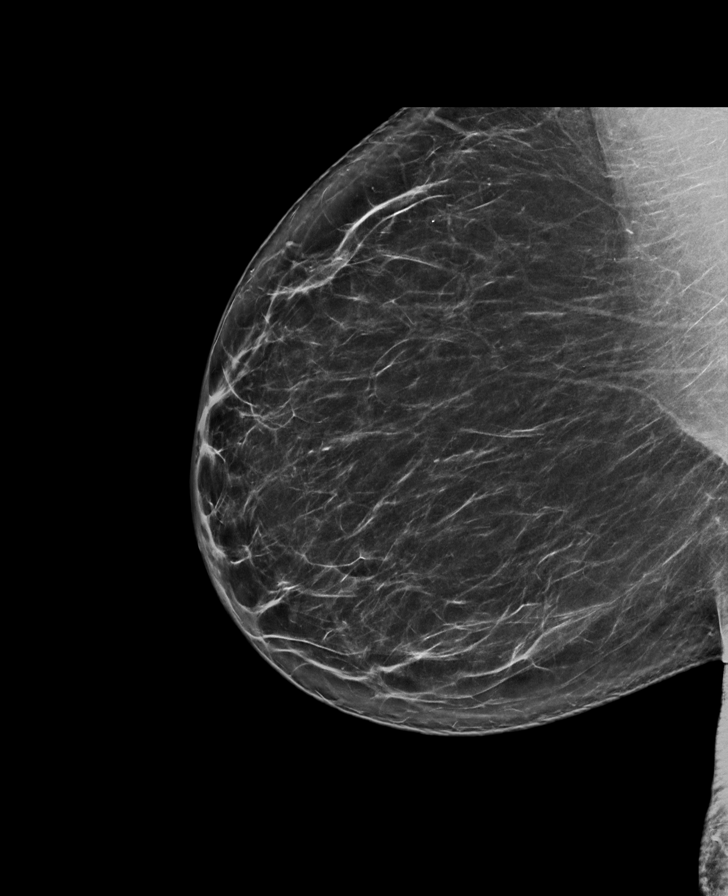

[L CC synth-2D]
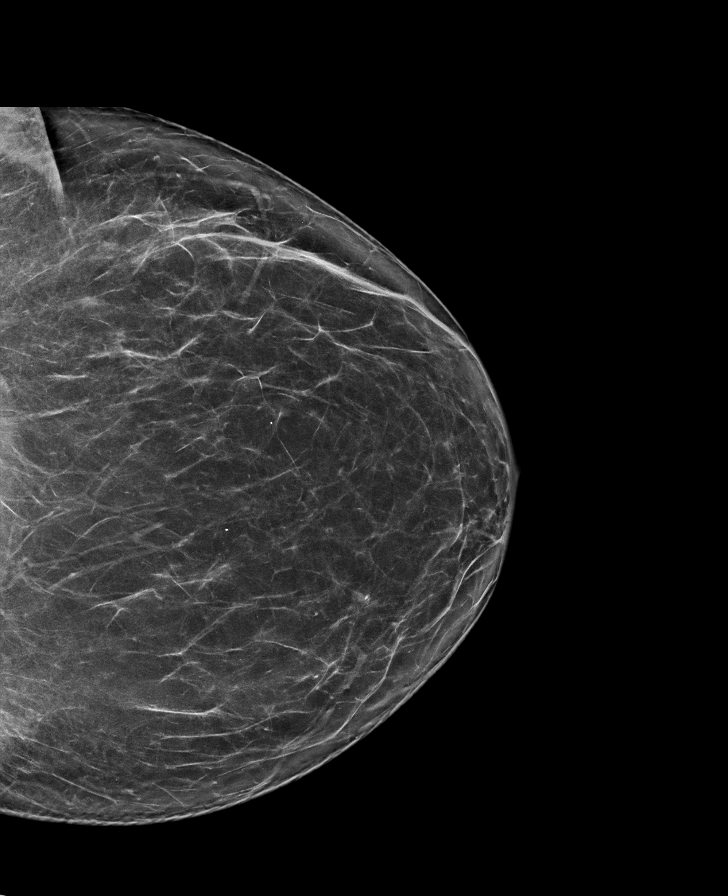

[L MLO synth-2D]
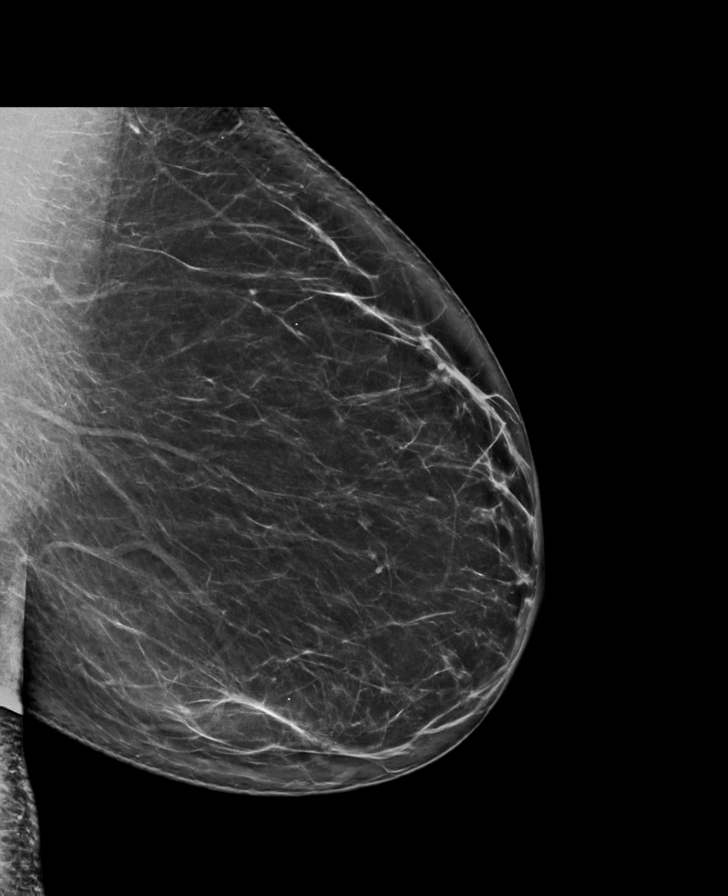

[R CC synth-2D]
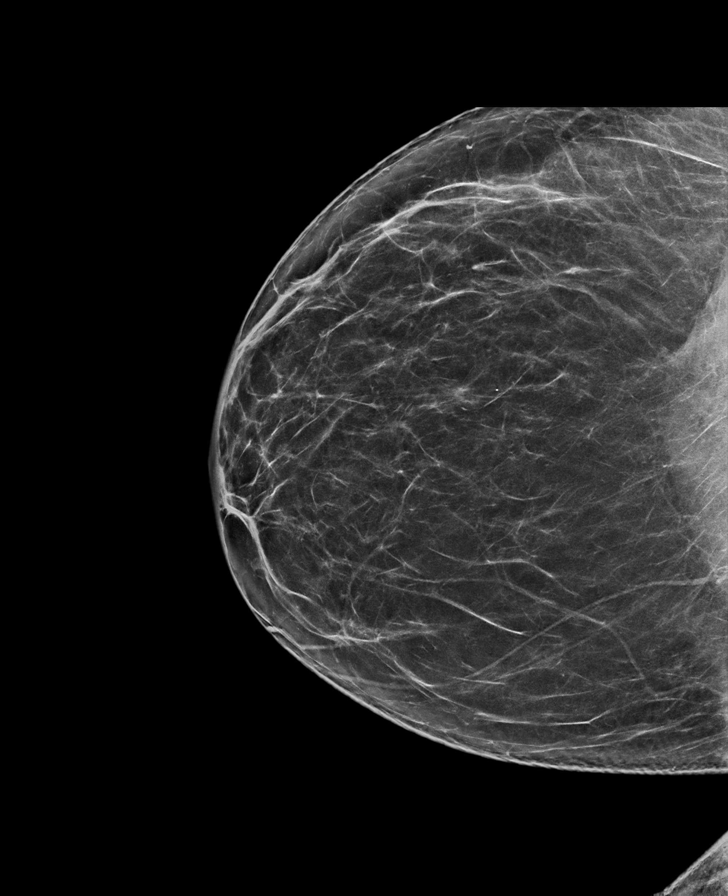

[L MLO tomo · tomo slice 44/87.0]
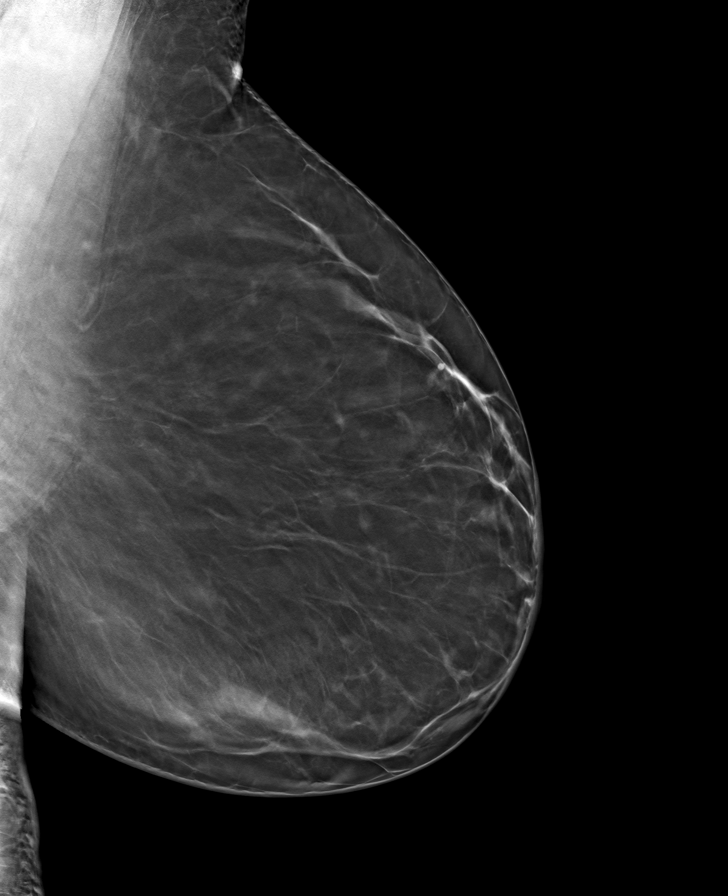

[R CC tomo · tomo slice 40/79.0]
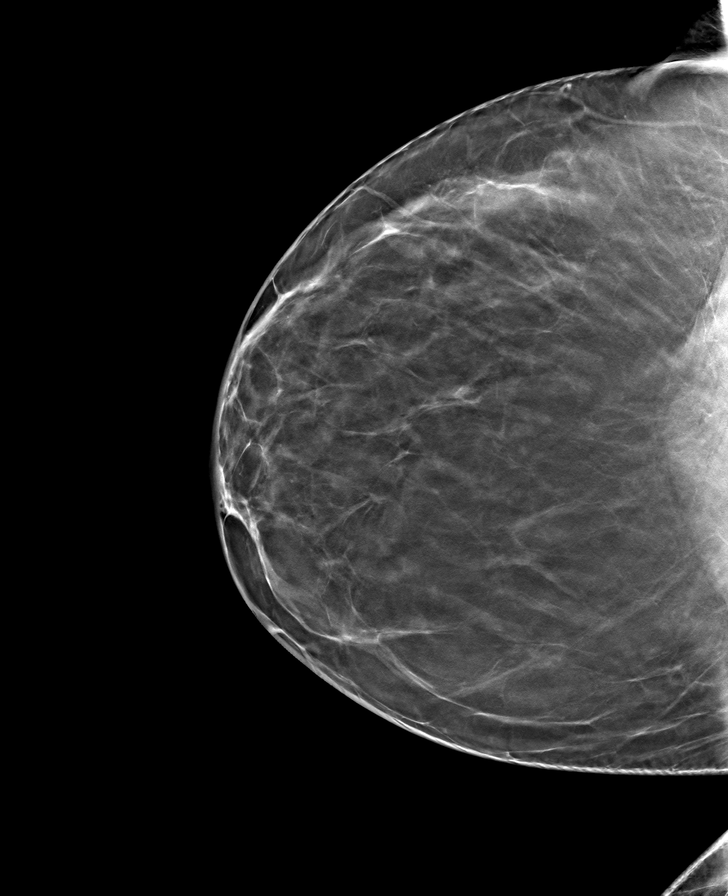

[L CC tomo · tomo slice 41/82.0]
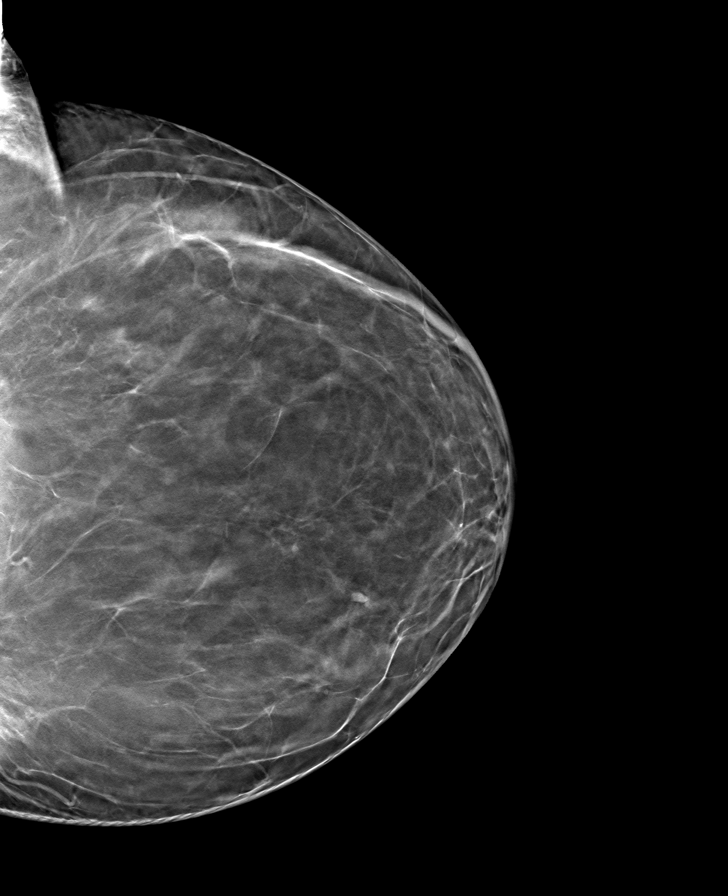

[R MLO tomo · tomo slice 43/84.0]
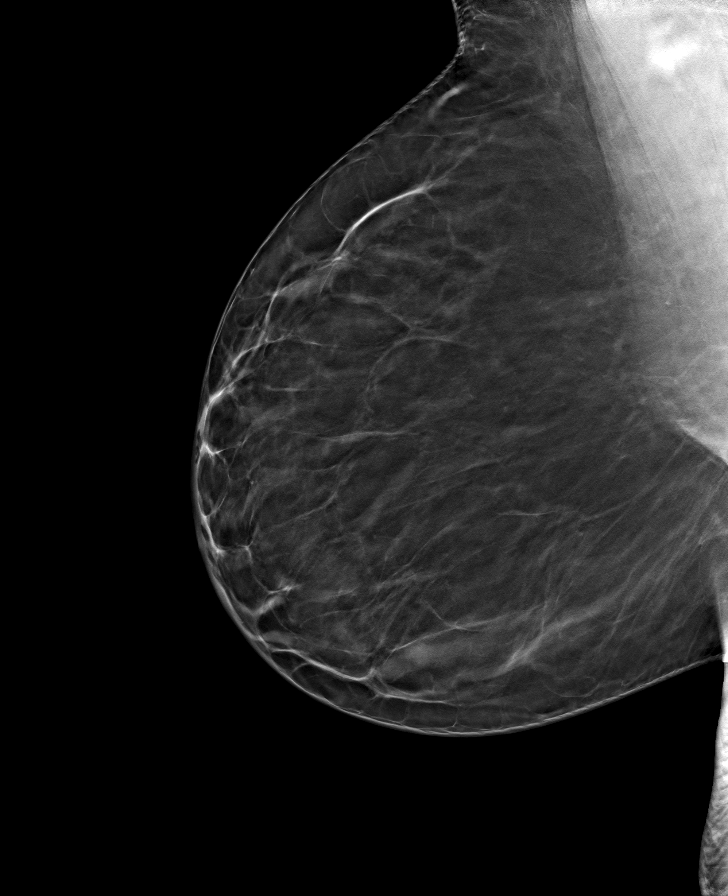

[8 of 24 positions shown; findings below may reference images not displayed]

ACR Breast Density Category b: There are scattered areas of
fibroglandular density.
FINDINGS: There are no findings suspicious for malignancy.
IMPRESSION: No mammographic evidence of malignancy. A result letter of this
screening mammogram will be mailed directly to the patient.

RECOMMENDATION:
Screening mammogram in one year. (Code:51-O-LD2)

BI-RADS CATEGORY  1: Negative.

## 2023-07-05 ENCOUNTER — Encounter: Payer: 59 | Admitting: Internal Medicine

## 2023-07-14 ENCOUNTER — Encounter: Payer: Self-pay | Admitting: Internal Medicine

## 2023-07-14 ENCOUNTER — Other Ambulatory Visit (INDEPENDENT_AMBULATORY_CARE_PROVIDER_SITE_OTHER)

## 2023-07-14 ENCOUNTER — Ambulatory Visit: Admitting: Internal Medicine

## 2023-07-14 VITALS — BP 118/78 | HR 72 | Temp 98.2°F | Ht 66.0 in | Wt 263.0 lb

## 2023-07-14 DIAGNOSIS — E538 Deficiency of other specified B group vitamins: Secondary | ICD-10-CM

## 2023-07-14 DIAGNOSIS — R739 Hyperglycemia, unspecified: Secondary | ICD-10-CM

## 2023-07-14 DIAGNOSIS — J4520 Mild intermittent asthma, uncomplicated: Secondary | ICD-10-CM

## 2023-07-14 DIAGNOSIS — Z0000 Encounter for general adult medical examination without abnormal findings: Secondary | ICD-10-CM

## 2023-07-14 DIAGNOSIS — E559 Vitamin D deficiency, unspecified: Secondary | ICD-10-CM

## 2023-07-14 DIAGNOSIS — Z13220 Encounter for screening for lipoid disorders: Secondary | ICD-10-CM

## 2023-07-14 DIAGNOSIS — I10 Essential (primary) hypertension: Secondary | ICD-10-CM | POA: Diagnosis not present

## 2023-07-14 LAB — URINALYSIS, ROUTINE W REFLEX MICROSCOPIC
Bilirubin Urine: NEGATIVE
Ketones, ur: NEGATIVE
Nitrite: NEGATIVE
Specific Gravity, Urine: 1.025 (ref 1.000–1.030)
Total Protein, Urine: NEGATIVE
Urine Glucose: NEGATIVE
Urobilinogen, UA: 0.2 (ref 0.0–1.0)
pH: 6 (ref 5.0–8.0)

## 2023-07-14 LAB — VITAMIN B12: Vitamin B-12: 463 pg/mL (ref 211–911)

## 2023-07-14 LAB — CBC WITH DIFFERENTIAL/PLATELET
Basophils Absolute: 0.1 10*3/uL (ref 0.0–0.1)
Basophils Relative: 1 % (ref 0.0–3.0)
Eosinophils Absolute: 0.2 10*3/uL (ref 0.0–0.7)
Eosinophils Relative: 3.6 % (ref 0.0–5.0)
HCT: 40.4 % (ref 36.0–46.0)
Hemoglobin: 13.4 g/dL (ref 12.0–15.0)
Lymphocytes Relative: 34.9 % (ref 12.0–46.0)
Lymphs Abs: 2.4 10*3/uL (ref 0.7–4.0)
MCHC: 33.2 g/dL (ref 30.0–36.0)
MCV: 85.3 fl (ref 78.0–100.0)
Monocytes Absolute: 0.6 10*3/uL (ref 0.1–1.0)
Monocytes Relative: 8.3 % (ref 3.0–12.0)
Neutro Abs: 3.5 10*3/uL (ref 1.4–7.7)
Neutrophils Relative %: 52.2 % (ref 43.0–77.0)
Platelets: 241 10*3/uL (ref 150.0–400.0)
RBC: 4.73 Mil/uL (ref 3.87–5.11)
RDW: 14.5 % (ref 11.5–15.5)
WBC: 6.8 10*3/uL (ref 4.0–10.5)

## 2023-07-14 LAB — COMPREHENSIVE METABOLIC PANEL WITH GFR
ALT: 36 U/L — ABNORMAL HIGH (ref 0–35)
AST: 32 U/L (ref 0–37)
Albumin: 4.3 g/dL (ref 3.5–5.2)
Alkaline Phosphatase: 50 U/L (ref 39–117)
BUN: 15 mg/dL (ref 6–23)
CO2: 32 meq/L (ref 19–32)
Calcium: 9.6 mg/dL (ref 8.4–10.5)
Chloride: 101 meq/L (ref 96–112)
Creatinine, Ser: 1.29 mg/dL — ABNORMAL HIGH (ref 0.40–1.20)
GFR: 47.37 mL/min — ABNORMAL LOW (ref >60.00)
Glucose, Bld: 102 mg/dL — ABNORMAL HIGH (ref 70–99)
Potassium: 3.4 meq/L — ABNORMAL LOW (ref 3.5–5.1)
Sodium: 142 meq/L (ref 135–145)
Total Bilirubin: 0.4 mg/dL (ref 0.2–1.2)
Total Protein: 7.2 g/dL (ref 6.0–8.3)

## 2023-07-14 LAB — LIPID PANEL
Cholesterol: 182 mg/dL (ref 0–200)
HDL: 54.5 mg/dL (ref >39.00)
LDL Cholesterol: 83 mg/dL (ref 0–99)
NonHDL: 127.75
Total CHOL/HDL Ratio: 3
Triglycerides: 226 mg/dL — ABNORMAL HIGH (ref 0.0–149.0)
VLDL: 45.2 mg/dL — ABNORMAL HIGH (ref 0.0–40.0)

## 2023-07-14 LAB — VITAMIN D 25 HYDROXY (VIT D DEFICIENCY, FRACTURES): VITD: 28.77 ng/mL — ABNORMAL LOW (ref 30.00–100.00)

## 2023-07-14 LAB — HEMOGLOBIN A1C: Hgb A1c MFr Bld: 6.5 % (ref 4.6–6.5)

## 2023-07-14 LAB — TSH: TSH: 6.7 u[IU]/mL — ABNORMAL HIGH (ref 0.35–5.50)

## 2023-07-14 MED ORDER — EPINEPHRINE 0.3 MG/0.3ML IJ SOAJ: 0.3000 mg | INTRAMUSCULAR | 3 refills | Status: AC | PRN

## 2023-07-14 MED ORDER — METHYLPREDNISOLONE 4 MG PO TBPK: ORAL_TABLET | ORAL | 0 refills | Status: AC

## 2023-07-14 MED ORDER — EPINEPHRINE 0.3 MG/0.3ML IJ SOAJ: 0.3000 mg | INTRAMUSCULAR | 3 refills | Status: DC | PRN

## 2023-07-14 NOTE — Assessment & Plan Note (Signed)
 On Vit D

## 2023-07-14 NOTE — Assessment & Plan Note (Signed)
 On B12

## 2023-07-14 NOTE — Assessment & Plan Note (Signed)
 BP Readings from Last 3 Encounters:  07/14/23 118/78  06/25/23 (!) 135/92  06/24/23 (!) 122/90   Will continue to monitor blood pressure.  Currently off blood pressure meds

## 2023-07-14 NOTE — Progress Notes (Signed)
 Subjective:  Patient ID: Laurie Hawkins, female    DOB: 27-Aug-1970  Age: 53 y.o. MRN: [id number]  CC: Annual Exam   HPI SCOTTLYN MCHANEY presents for a well exam S/p ER visit on 06/25/23 for cough - much better   Outpatient Medications Prior to Visit  Medication Sig Dispense Refill   albuterol (PROVENTIL HFA;VENTOLIN HFA) 108 (90 BASE) MCG/ACT inhaler Inhale into the lungs every 6 (six) hours as needed for wheezing or shortness of breath.     betamethasone  dipropionate (DIPROLENE ) 0.05 % ointment Apply to affected area bid for up to 2 weeks with flares 50 g 3   cetirizine  (ZYRTEC ) 10 MG tablet Take 1 tablet (10 mg total) by mouth daily. 90 tablet 3   Cholecalciferol (VITAMIN D3) 5000 units CAPS 1 po qd 90 capsule 3   diphenhydrAMINE  (BENADRYL ) 25 MG tablet Take 2 tablets (50 mg total) by mouth every 4 (four) hours as needed for allergies. 60 tablet 0   estradiol  (ESTRACE  VAGINAL) 0.1 MG/GM vaginal cream Place 1 g vaginally 3 (three) times a week. 42.5 g 12   famotidine  (PEPCID ) 40 MG tablet Take 1 tablet (40 mg total) by mouth daily. 90 tablet 3   fluticasone  (FLONASE ) 50 MCG/ACT nasal spray Place 1 spray into both nostrils daily. 16 g 11   levothyroxine  (SYNTHROID , LEVOTHROID) 100 MCG tablet Take 1 tablet (100 mcg total) by mouth daily. (Patient taking differently: Take 100 mcg by mouth. Monday thru friday) 90 tablet 3   Levothyroxine  Sodium (EUTHYROX  PO) Take 125 mg by mouth. On the weekends     MAGNESIUM PO Take by mouth as needed. + melatonin     metFORMIN (GLUCOPHAGE-XR) 500 MG 24 hr tablet Take 500 mg by mouth daily with breakfast.     pimecrolimus  (ELIDEL ) 1 % cream Apply to affected area bid when taking break from steroid 100 g 3   pseudoephedrine  (SUDAFED) 30 MG tablet Take 2 tablets (60 mg total) by mouth every 4 (four) hours as needed for congestion. 30 tablet 0   Safety Seal Miscellaneous MISC Apply 1 Application topically in the morning. MEDICATION NAME; Melaxemic Cream 30 g 0    UNABLE TO FIND Med Name: Veola 10/2,5/10     EPINEPHrine  0.3 mg/0.3 mL IJ SOAJ injection Inject 0.3 mg into the muscle as needed. 1 each 3   meloxicam  (MOBIC ) 15 MG tablet Take 1 tablet (15 mg total) by mouth daily. 30 tablet 0   benzonatate  (TESSALON ) 100 MG capsule Take 1 capsule (100 mg total) by mouth every 8 (eight) hours. (Patient not taking: Reported on 07/14/2023) 21 capsule 0   azithromycin  (ZITHROMAX ) 250 MG tablet Take 1 tablet (250 mg total) by mouth daily. Take first 2 tablets together, then 1 every day until finished. (Patient not taking: Reported on 07/14/2023) 6 tablet 0   No facility-administered medications prior to visit.    ROS: Review of Systems  Constitutional:  Negative for activity change, appetite change, chills, fatigue and unexpected weight change.  HENT:  Negative for congestion, mouth sores and sinus pressure.   Eyes:  Negative for visual disturbance.  Respiratory:  Negative for cough and chest tightness.   Gastrointestinal:  Negative for abdominal pain and nausea.  Genitourinary:  Negative for difficulty urinating, frequency and vaginal pain.  Musculoskeletal:  Negative for back pain and gait problem.  Skin:  Negative for pallor and rash.  Neurological:  Negative for dizziness, tremors, weakness, numbness and headaches.  Psychiatric/Behavioral:  Negative for confusion  and sleep disturbance.     Objective:  BP 118/78 (BP Location: Left Arm, Patient Position: Sitting, Cuff Size: Large)   Pulse 72   Temp 98.2 F (36.8 C) (Oral)   Ht 5' 6 (1.676 m)   Wt 263 lb (119.3 kg)   LMP 06/29/2017 (Approximate)   SpO2 97%   BMI 42.45 kg/m   BP Readings from Last 3 Encounters:  07/14/23 118/78  06/25/23 (!) 135/92  06/24/23 (!) 122/90    Wt Readings from Last 3 Encounters:  07/14/23 263 lb (119.3 kg)  06/24/23 260 lb (117.9 kg)  08/26/22 258 lb (117 kg)    Physical Exam Constitutional:      General: She is not in acute distress.    Appearance: She  is well-developed.  HENT:     Head: Normocephalic.     Right Ear: External ear normal.     Left Ear: External ear normal.     Nose: Nose normal.  Eyes:     General:        Right eye: No discharge.        Left eye: No discharge.     Conjunctiva/sclera: Conjunctivae normal.     Pupils: Pupils are equal, round, and reactive to light.  Neck:     Thyroid : No thyromegaly.     Vascular: No JVD.     Trachea: No tracheal deviation.  Cardiovascular:     Rate and Rhythm: Normal rate and regular rhythm.     Heart sounds: Normal heart sounds.  Pulmonary:     Effort: No respiratory distress.     Breath sounds: No stridor. No wheezing.  Abdominal:     General: Bowel sounds are normal. There is no distension.     Palpations: Abdomen is soft. There is no mass.     Tenderness: There is no abdominal tenderness. There is no guarding or rebound.  Musculoskeletal:        General: No tenderness.     Cervical back: Normal range of motion and neck supple. No rigidity.  Lymphadenopathy:     Cervical: No cervical adenopathy.  Skin:    Findings: No erythema or rash.  Neurological:     Cranial Nerves: No cranial nerve deficit.     Motor: No abnormal muscle tone.     Coordination: Coordination normal.     Deep Tendon Reflexes: Reflexes normal.  Psychiatric:        Behavior: Behavior normal.        Thought Content: Thought content normal.        Judgment: Judgment normal.     Lab Results  Component Value Date   WBC 6.8 07/14/2023   HGB 13.4 07/14/2023   HCT 40.4 07/14/2023   PLT 241.0 07/14/2023   GLUCOSE 102 (H) 07/14/2023   CHOL 182 07/14/2023   TRIG 226.0 (H) 07/14/2023   HDL 54.50 07/14/2023   LDLCALC 83 07/14/2023   ALT 36 (H) 07/14/2023   AST 32 07/14/2023   NA 142 07/14/2023   K 3.4 (L) 07/14/2023   CL 101 07/14/2023   CREATININE 1.29 (H) 07/14/2023   BUN 15 07/14/2023   CO2 32 07/14/2023   TSH 6.70 (H) 07/14/2023   HGBA1C 6.5 07/14/2023    US  Abdomen Limited RUQ  (LIVER/GB) Result Date: 06/25/2023 CLINICAL DATA:  Right upper quadrant pain for several months. EXAM: ULTRASOUND ABDOMEN LIMITED RIGHT UPPER QUADRANT COMPARISON:  None Available. FINDINGS: Gallbladder: No gallstones or wall thickening visualized. No sonographic Murphy sign noted  by sonographer. Common bile duct: Diameter: 3 mm, within normal limits. Liver: Diffusely increased echogenicity of the hepatic parenchyma, consistent with hepatic steatosis. No hepatic mass identified. Portal vein is patent on color Doppler imaging with normal direction of blood flow towards the liver. Other: None. IMPRESSION: No evidence of cholelithiasis or biliary ductal dilatation. Diffuse hepatic steatosis. Electronically Signed   By: Norleen DELENA Kil M.D.   On: 06/25/2023 08:58   DG Chest Port 1 View Result Date: 06/25/2023 CLINICAL DATA:  Chest pain with 2 week history of cough. EXAM: PORTABLE CHEST 1 VIEW COMPARISON:  07/08/2017 FINDINGS: The lungs are clear without focal pneumonia, edema, pneumothorax or pleural effusion. The cardiopericardial silhouette is within normal limits for size. No acute bony abnormality. IMPRESSION: No active disease. Electronically Signed   By: Camellia Candle M.D.   On: 06/25/2023 06:34    Assessment & Plan:   Problem List Items Addressed This Visit     Vitamin D  deficiency   On Vit D      Relevant Orders   VITAMIN D  25 Hydroxy (Vit-D Deficiency, Fractures) (Completed)   Essential hypertension   BP Readings from Last 3 Encounters:  07/14/23 118/78  06/25/23 (!) 135/92  06/24/23 (!) 122/90   Will continue to monitor blood pressure.  Currently off blood pressure meds      Relevant Medications   EPINEPHrine  0.3 mg/0.3 mL IJ SOAJ injection   Asthma   Recent exacerbation - much better      Relevant Medications   methylPREDNISolone  (MEDROL  DOSEPAK) 4 MG TBPK tablet   Well adult exam - Primary   We discussed age appropriate health related issues, including available/recomended  screening tests and vaccinations. Labs were ordered to be later reviewed . All questions were answered. We discussed one or more of the following - seat belt use, use of sunscreen/sun exposure exercise, fall risk reduction, second hand smoke exposure, firearm use and storage, seat belt use, a need for adhering to healthy diet and exercise. Labs were ordered.  All questions were answered. Colonoscopy with her gastroenterologist in [place].  Potentially she could have EGD and colonoscopy on the same day      Relevant Orders   TSH (Completed)   Urinalysis   CBC with Differential/Platelet (Completed)   Lipid panel (Completed)   Comprehensive metabolic panel with GFR (Completed)   Vitamin B12 (Completed)   VITAMIN D  25 Hydroxy (Vit-D Deficiency, Fractures) (Completed)   Hemoglobin A1c (Completed)   B12 deficiency   On B12      Relevant Orders   Vitamin B12 (Completed)   Other Visit Diagnoses       Hyperglycemia       Relevant Orders   Hemoglobin A1c (Completed)         Meds ordered this encounter  Medications   DISCONTD: EPINEPHrine  0.3 mg/0.3 mL IJ SOAJ injection    Sig: Inject 0.3 mg into the muscle as needed.    Dispense:  1 each    Refill:  3   EPINEPHrine  0.3 mg/0.3 mL IJ SOAJ injection    Sig: Inject 0.3 mg into the muscle as needed.    Dispense:  2 each    Refill:  3   methylPREDNISolone  (MEDROL  DOSEPAK) 4 MG TBPK tablet    Sig: As directed    Dispense:  21 tablet    Refill:  0      Follow-up: Return in about 1 year (around 07/13/2024) for Wellness Exam.  Marolyn Noel, MD

## 2023-07-14 NOTE — Assessment & Plan Note (Signed)
 Recent exacerbation - much better

## 2023-07-14 NOTE — Patient Instructions (Signed)

## 2023-07-14 NOTE — Assessment & Plan Note (Signed)
  We discussed age appropriate health related issues, including available/recomended screening tests and vaccinations. Labs were ordered to be later reviewed . All questions were answered. We discussed one or more of the following - seat belt use, use of sunscreen/sun exposure exercise, fall risk reduction, second hand smoke exposure, firearm use and storage, seat belt use, a need for adhering to healthy diet and exercise. Labs were ordered.  All questions were answered. Colonoscopy with her gastroenterologist in [place].  Potentially she could have EGD and colonoscopy on the same day

## 2023-07-18 ENCOUNTER — Ambulatory Visit: Payer: Self-pay | Admitting: Internal Medicine

## 2023-07-18 DIAGNOSIS — E890 Postprocedural hypothyroidism: Secondary | ICD-10-CM

## 2023-07-18 DIAGNOSIS — N189 Chronic kidney disease, unspecified: Secondary | ICD-10-CM | POA: Insufficient documentation

## 2023-07-22 ENCOUNTER — Other Ambulatory Visit: Payer: Self-pay | Admitting: Internal Medicine

## 2023-07-22 MED ORDER — POTASSIUM CHLORIDE ER 10 MEQ PO TBCR: 10.0000 meq | EXTENDED_RELEASE_TABLET | Freq: Every day | ORAL | 3 refills | Status: AC

## 2023-08-04 NOTE — Progress Notes (Unsigned)
 [facility] [address] [place] [address] Phone: [phone] Subjective:   Laurie Hawkins am a scribe for Dr. Claudene.   I'm seeing this patient by the request  of:  Plotnikov, Karlynn GAILS, MD  CC: Thumb and knee pain  YEP:Dlagzrupcz  06/24/2023 Bilaterally ductions given today and tolerated the procedure well, discussed icing regimen of home exercises, which activities to do in which ones to avoid.  Still wants to avoid any type of surgical intervention.  Has had mouth bleeding injections over the course of the year.  Hopeful that this will start to calm down.     Chronic problem with worsening symptoms, now had actually inflammation noted.  Given an injection today.  Discussed icing regimen and home exercises, discussed which activities to do and which ones to avoid.  Increase activity slowly.  Follow-up with me again in 6 to 8 weeks otherwise worsening pain could be a candidate for possible viscosupplementation.      Update 08/05/2023 Laurie Hawkins is a 53 y.o. female coming in with complaint of B thumb and B knee pain. Patient states was doing great but whatever he did has worn off. Left knee is better but right knee is hurting more than the left.       Past Medical History:  Diagnosis Date   Asthma    Hypertension    Prediabetes    Thyroid  cancer (HCC)    Thyroid  disease    Past Surgical History:  Procedure Laterality Date   CESAREAN SECTION  2009   THYROIDECTOMY  2011   Social History   Socioeconomic History   Marital status: Married    Spouse name: Not on file   Number of children: Not on file   Years of education: Not on file   Highest education level: Not on file  Occupational History   Not on file  Tobacco Use   Smoking status: Never   Smokeless tobacco: Never  Vaping Use   Vaping status: Never Used  Substance and Sexual Activity   Alcohol use: Not Currently    Comment: 1/2 a beer a week   Drug use: No   Sexual  activity: Not Currently    Partners: Male    Birth control/protection: Post-menopausal  Other Topics Concern   Not on file  Social History Narrative   Not on file   Social Drivers of Health   Financial Resource Strain: Not on file  Food Insecurity: Not on file  Transportation Needs: Not on file  Physical Activity: Not on file  Stress: Not on file  Social Connections: Not on file   Allergies  Allergen Reactions   Bee Venom Anaphylaxis   Ace Inhibitors     Angioedema    Family History  Problem Relation Age of Onset   Stroke Mother    Hypertension Mother    Breast cancer Mother    Stroke Father    Hypertension Father    Prostate cancer Father    Heart disease Father    Congestive Heart Failure Father    Hypertension Sister    Hypertension Sister    Diabetes Maternal Grandfather     Current Outpatient Medications (Endocrine & Metabolic):    levothyroxine  (SYNTHROID , LEVOTHROID) 100 MCG tablet, Take 1 tablet (100 mcg total) by mouth daily. (Patient taking differently: Take 100 mcg by mouth. Monday thru friday)   Levothyroxine  Sodium (EUTHYROX  PO), Take 125 mg by mouth. On the weekends   metFORMIN (  GLUCOPHAGE-XR) 500 MG 24 hr tablet, Take 500 mg by mouth daily with breakfast.   methylPREDNISolone  (MEDROL  DOSEPAK) 4 MG TBPK tablet, As directed   predniSONE  (DELTASONE ) 20 MG tablet, Take 1 tablet (20 mg total) by mouth 2 (two) times daily.  Current Outpatient Medications (Cardiovascular):    EPINEPHrine  0.3 mg/0.3 mL IJ SOAJ injection, Inject 0.3 mg into the muscle as needed.  Current Outpatient Medications (Respiratory):    albuterol (PROVENTIL HFA;VENTOLIN HFA) 108 (90 BASE) MCG/ACT inhaler, Inhale into the lungs every 6 (six) hours as needed for wheezing or shortness of breath.   benzonatate  (TESSALON ) 100 MG capsule, Take 1 capsule (100 mg total) by mouth every 8 (eight) hours.   cetirizine  (ZYRTEC ) 10 MG tablet, Take 1 tablet (10 mg total) by mouth daily.    diphenhydrAMINE  (BENADRYL ) 25 MG tablet, Take 2 tablets (50 mg total) by mouth every 4 (four) hours as needed for allergies.   fluticasone  (FLONASE ) 50 MCG/ACT nasal spray, Place 1 spray into both nostrils daily.   pseudoephedrine  (SUDAFED) 30 MG tablet, Take 2 tablets (60 mg total) by mouth every 4 (four) hours as needed for congestion.  Current Outpatient Medications (Analgesics):    meloxicam  (MOBIC ) 15 MG tablet, Take 1 tablet (15 mg total) by mouth daily.   Current Outpatient Medications (Other):    betamethasone  dipropionate (DIPROLENE ) 0.05 % ointment, Apply to affected area bid for up to 2 weeks with flares   Cholecalciferol (VITAMIN D3) 5000 units CAPS, 1 po qd   estradiol  (ESTRACE  VAGINAL) 0.1 MG/GM vaginal cream, Place 1 g vaginally 3 (three) times a week.   famotidine  (PEPCID ) 40 MG tablet, Take 1 tablet (40 mg total) by mouth daily.   MAGNESIUM PO, Take by mouth as needed. + melatonin   pimecrolimus  (ELIDEL ) 1 % cream, Apply to affected area bid when taking break from steroid   potassium chloride  (KLOR-CON ) 10 MEQ tablet, Take 1 tablet (10 mEq total) by mouth daily.   Safety Seal Miscellaneous MISC, Apply 1 Application topically in the morning. MEDICATION NAME; Melaxemic Cream   UNABLE TO FIND, Med Name: Veola 10/2,5/10   Reviewed prior external information including notes and imaging from  primary care provider As well as notes that were available from care everywhere and other healthcare systems.  Past medical history, social, surgical and family history all reviewed in electronic medical record.  No pertanent information unless stated regarding to the chief complaint.   Review of Systems:  No headache, visual changes, nausea, vomiting, diarrhea, constipation, dizziness, abdominal pain, skin rash, fevers, chills, night sweats, weight loss, swollen lymph nodes, body aches, joint swelling, chest pain, shortness of breath, mood changes. POSITIVE muscle aches  Objective   Blood pressure (!) 140/80, pulse 73, height 5' 6 (1.676 m), weight 266 lb (120.7 kg), last menstrual period 06/29/2017, SpO2 98%.   General: No apparent distress alert and oriented x3 mood and affect normal, dressed appropriately.  HEENT: Pupils equal, extraocular movements intact  Respiratory: Patient's speak in full sentences and does not appear short of breath  Cardiovascular: No lower extremity edema, non tender, no erythema  Right knee does have a trace effusion noted.  Positive crepitus.  Lateral tracking noted.  Minimal tenderness both knees no.  Left knee positive grind test noted.  No trigger nodule noted.  Negative Tinel's  After informed written and verbal consent, patient was seated on exam table. Right knee was prepped with alcohol swab and utilizing anterolateral approach, patient's right knee space was injected with  4:1  marcaine 0.5%: Kenalog  40mg /dL. Patient tolerated the procedure well without immediate complications.   Procedure: Real-time Ultrasound Guided Injection of left CMC joint Device: GE Logiq Q7 Ultrasound guided injection is preferred based studies that show increased duration, increased effect, greater accuracy, decreased procedural pain, increased response rate, and decreased cost with ultrasound guided versus blind injection.  Verbal informed consent obtained.  Time-out conducted.  Noted no overlying erythema, induration, or other signs of local infection.  Skin prepped in a sterile fashion.  Local anesthesia: Topical Ethyl chloride.  With sterile technique and under real time ultrasound guidance: With a 25-gauge half inch needle injected with 0.5 cc of 0.5% Marcaine and 0.5 cc of Kenalog  40 mg/mL into the left CMC joint Completed without difficulty  Pain immediately resolved suggesting accurate placement of the medication.  Advised to call if fevers/chills, erythema, induration, drainage, or persistent bleeding.  Images permanently stored and available for  review in the ultrasound unit.  Images saved Impression: Technically successful ultrasound guided injection.    Impression and Recommendations:

## 2023-08-05 ENCOUNTER — Encounter: Payer: Self-pay | Admitting: Family Medicine

## 2023-08-05 ENCOUNTER — Telehealth: Payer: Self-pay

## 2023-08-05 ENCOUNTER — Ambulatory Visit: Admitting: Family Medicine

## 2023-08-05 ENCOUNTER — Other Ambulatory Visit: Payer: Self-pay

## 2023-08-05 VITALS — BP 140/80 | HR 73 | Ht 66.0 in | Wt 266.0 lb

## 2023-08-05 DIAGNOSIS — M180 Bilateral primary osteoarthritis of first carpometacarpal joints: Secondary | ICD-10-CM | POA: Diagnosis not present

## 2023-08-05 DIAGNOSIS — M79642 Pain in left hand: Secondary | ICD-10-CM | POA: Diagnosis not present

## 2023-08-05 DIAGNOSIS — M79641 Pain in right hand: Secondary | ICD-10-CM | POA: Diagnosis not present

## 2023-08-05 DIAGNOSIS — M222X1 Patellofemoral disorders, right knee: Secondary | ICD-10-CM | POA: Diagnosis not present

## 2023-08-05 DIAGNOSIS — M222X2 Patellofemoral disorders, left knee: Secondary | ICD-10-CM

## 2023-08-05 MED ORDER — MELOXICAM 15 MG PO TABS: 15.0000 mg | ORAL_TABLET | Freq: Every day | ORAL | 0 refills | Status: AC

## 2023-08-05 MED ORDER — PREDNISONE 20 MG PO TABS: 20.0000 mg | ORAL_TABLET | Freq: Two times a day (BID) | ORAL | 0 refills | Status: AC

## 2023-08-05 NOTE — Patient Instructions (Addendum)
 Injected R knee and L thumb today Meloxicam  15mg  take in 5 day bursts 40mg  of prednisone  10 days See me when you are back

## 2023-08-05 NOTE — Assessment & Plan Note (Signed)
 Right knee injected.  Tolerated the procedure well, discussed icing regimen of home exercises, which activities to do in which ones to avoid.  Increase activity slowly.  Could be a candidate for viscosupplementation and we will get approval but patient will be living in [place] for some months before she returns.

## 2023-08-05 NOTE — Telephone Encounter (Signed)
 Patient ran for Monovisc for bilateral knees on 08/05/23. Case [id number]. Pending approval.

## 2023-08-05 NOTE — Assessment & Plan Note (Signed)
 Patient given injection and tolerated the procedure well, discussed icing regimen of home exercises, which activities to do in which ones to avoid.  Hopeful that this will make improvement.  Did discuss quickly about the possibility of hypopigmentation where we did the injection this time.  Follow-up with me again in 6 to 8 weeks.  Given meloxicam  15 mg to take an 5-day burst and given 2 rounds of prednisone  if necessary.

## 2023-08-10 NOTE — Telephone Encounter (Signed)
 PA required. Faxed information to number provided on [employer]. Pending approval.

## 2023-08-16 NOTE — Telephone Encounter (Signed)
 MONOVISC Denied  1) Xrays showing radiographic evidence of osteoarthritis.    WE HAVE LEFT KNEE ONLY  2) Documentation that knee pain interferes with   functional activities (ambulation/prolonged standing)  3) Trial and failure or unable to use non pharmacologic treatment options (PT, HEP, weight reduction)  NEED HEP PRINTED   4) Trial and failure for at least three months or are unable to use an analgesic  5)Trial and failure for at least three months or are unable to use intra articular steroid injections  6) documentation that a total knee replacement is not scheduled with in 6 months of starting treatment with the requested medication  WE NEED  *Right knee xray  *HEP printed for knees   *Addend office note describing knee pain interferes with  functional activities  *addend office note stating patient is not undergoing a knee replacement with in 6 months  *3 months would be 09/24/23  We would NOT be able to pre cert the MONOVISC until 09/24/23 or after
